# Patient Record
Sex: Male | Born: 1990 | Race: White | Hispanic: No | Marital: Married | Smoking: Never smoker
Health system: Southern US, Community
[De-identification: ages and names within clinical notes are randomized; demographics above are authoritative.]

---

## 2018-07-15 ENCOUNTER — Encounter: Payer: Self-pay | Admitting: *Deleted

## 2018-07-20 ENCOUNTER — Encounter: Payer: Self-pay | Admitting: Family Medicine

## 2018-07-20 ENCOUNTER — Ambulatory Visit (INDEPENDENT_AMBULATORY_CARE_PROVIDER_SITE_OTHER): Payer: Managed Care, Other (non HMO) | Admitting: Family Medicine

## 2018-07-20 VITALS — BP 122/80 | HR 67 | Ht 75.0 in | Wt 230.2 lb

## 2018-07-20 DIAGNOSIS — Z0000 Encounter for general adult medical examination without abnormal findings: Secondary | ICD-10-CM

## 2018-07-20 LAB — URINALYSIS, ROUTINE W REFLEX MICROSCOPIC
Ketones, ur: NEGATIVE
Leukocytes, UA: NEGATIVE
Nitrite: NEGATIVE
Specific Gravity, Urine: >=1.03 — AB (ref 1.000–1.030)
Total Protein, Urine: 30 — AB
Urine Glucose: NEGATIVE
Urobilinogen, UA: 0.2 (ref 0.0–1.0)
pH: 5.5 (ref 5.0–8.0)

## 2018-07-20 LAB — COMPREHENSIVE METABOLIC PANEL WITH GFR
ALT: 15 U/L (ref 0–53)
AST: 20 U/L (ref 0–37)
Albumin: 4.5 g/dL (ref 3.5–5.2)
Alkaline Phosphatase: 73 U/L (ref 39–117)
BUN: 11 mg/dL (ref 6–23)
CO2: 29 meq/L (ref 19–32)
Calcium: 9 mg/dL (ref 8.4–10.5)
Chloride: 104 meq/L (ref 96–112)
Creatinine, Ser: 1.06 mg/dL (ref 0.40–1.50)
GFR: 87.94 mL/min
Glucose, Bld: 93 mg/dL (ref 70–99)
Potassium: 4.3 meq/L (ref 3.5–5.1)
Sodium: 140 meq/L (ref 135–145)
Total Bilirubin: 1.3 mg/dL — ABNORMAL HIGH (ref 0.2–1.2)
Total Protein: 7.4 g/dL (ref 6.0–8.3)

## 2018-07-20 LAB — LIPID PANEL
CHOL/HDL RATIO: 5
Cholesterol: 132 mg/dL (ref 0–200)
HDL: 28.5 mg/dL — AB (ref >39.00)
LDL Cholesterol: 73 mg/dL (ref 0–99)
NONHDL: 103.37
Triglycerides: 152 mg/dL — ABNORMAL HIGH (ref 0.0–149.0)
VLDL: 30.4 mg/dL (ref 0.0–40.0)

## 2018-07-20 LAB — CBC
HCT: 48.2 % (ref 39.0–52.0)
Hemoglobin: 16.1 g/dL (ref 13.0–17.0)
MCHC: 33.4 g/dL (ref 30.0–36.0)
MCV: 84.3 fl (ref 78.0–100.0)
Platelets: 199 10*3/uL (ref 150.0–400.0)
RBC: 5.72 Mil/uL (ref 4.22–5.81)
RDW: 13.2 % (ref 11.5–15.5)
WBC: 6.9 10*3/uL (ref 4.0–10.5)

## 2018-07-20 NOTE — Progress Notes (Signed)
Subjective:  Patient ID: Jaivon Vanbeek, male    DOB: Jan 06, 1991  Age: 27 y.o. MRN: [id number]  CC: Establish Care   HPI Jyron Turman presents for a physical exam.  He is healthy as far as he knows and has no medical issues.  He has been in this country a year and a half now.  He works at a [employer].  He lives with his wife and 19-month-old son.  He is from [place] area.  His dad is 53 and has no health issues.  His mom is 50 and is also in good health.  1 of his grandmothers lived until age 27.  Patient does not smoke drink or use illicit drugs.  He exercises on a regular basis.  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  History Gregorio has no past medical history on file.   He has no past surgical history on file.   His family history is not on file.He reports that he has never smoked. He has never used smokeless tobacco. His alcohol and drug histories are not on file.  No outpatient medications prior to visit.   No facility-administered medications prior to visit.     ROS Review of Systems  Constitutional: Negative for chills, fever and unexpected weight change.  HENT: Negative.   Eyes: Negative.   Respiratory: Negative.   Cardiovascular: Negative.   Gastrointestinal: Negative.   Endocrine: Negative.   Genitourinary: Negative.   Musculoskeletal: Negative.   Allergic/Immunologic: Negative for immunocompromised state.  Neurological: Negative.   Hematological: Negative.   Psychiatric/Behavioral: Negative.     Objective:  BP 122/80   Pulse 67   Ht 6\' 3"  (1.905 m)   Wt 230 lb 4 oz (104.4 kg)   SpO2 97%   BMI 28.78 kg/m   Physical Exam  Constitutional: He is oriented to person, place, and time. He appears well-developed and well-nourished. No distress.  HENT:  Head: Normocephalic and atraumatic.  Right Ear: External ear normal.  Left Ear:  External ear normal.  Mouth/Throat: Oropharynx is clear and moist. No oropharyngeal exudate.  Eyes: Pupils are equal, round, and reactive to light. Conjunctivae and EOM are normal. Right eye exhibits no discharge. Left eye exhibits no discharge.  Neck: Normal range of motion. Neck supple. No JVD present. No tracheal deviation present. No thyromegaly present.  Cardiovascular: Normal rate, regular rhythm and normal heart sounds.  Pulmonary/Chest: Effort normal and breath sounds normal.  Abdominal: Soft. Bowel sounds are normal. He exhibits no distension. There is no guarding. Hernia confirmed negative in the right inguinal area and confirmed negative in the left inguinal area.  Genitourinary: Testes normal and penis normal. Right testis shows no mass and no tenderness. Left testis shows no swelling and no tenderness. Left testis is descended. Circumcised. No hypospadias or penile tenderness. No discharge found.  Musculoskeletal: He exhibits no edema, tenderness or deformity.  Lymphadenopathy:    He has no cervical adenopathy. No inguinal adenopathy noted on the right or left side.  Neurological: He is alert and oriented to person, place, and time. He displays normal reflexes.  Skin: Skin is warm and dry. He is not diaphoretic.  Psychiatric: He has a normal mood and affect.      Assessment & Plan:   Albaro was seen today for establish care.  Diagnoses and all orders for this visit:  Health care maintenance -  CBC -     Comprehensive metabolic panel -     Lipid panel -     Urinalysis, Routine w reflex microscopic   Raheel Lynam does not currently have medications on file.  No orders of the defined types were placed in this encounter.  Patient was encouraged to maintain his healthy lifestyle.  Anticipatory guidance was given for health maintenance and prevention of disease.  He will follow-up in a year or as needed.  Follow-up: Return if symptoms worsen or fail to  improve.  Mliss SaxWilliam Alfred Nikai Quest, MD

## 2018-07-20 NOTE — Patient Instructions (Signed)

## 2018-07-21 ENCOUNTER — Other Ambulatory Visit: Payer: Self-pay

## 2018-07-21 DIAGNOSIS — R8290 Unspecified abnormal findings in urine: Secondary | ICD-10-CM

## 2018-07-30 ENCOUNTER — Other Ambulatory Visit (INDEPENDENT_AMBULATORY_CARE_PROVIDER_SITE_OTHER): Payer: Managed Care, Other (non HMO)

## 2018-07-30 DIAGNOSIS — R8290 Unspecified abnormal findings in urine: Secondary | ICD-10-CM | POA: Diagnosis not present

## 2018-07-30 LAB — URINALYSIS, ROUTINE W REFLEX MICROSCOPIC
Bilirubin Urine: NEGATIVE
Hgb urine dipstick: NEGATIVE
KETONES UR: NEGATIVE
LEUKOCYTES UA: NEGATIVE
NITRITE: NEGATIVE
SPECIFIC GRAVITY, URINE: 1.025 (ref 1.000–1.030)
Total Protein, Urine: NEGATIVE
Urine Glucose: NEGATIVE
Urobilinogen, UA: 0.2 (ref 0.0–1.0)
WBC, UA: NONE SEEN — AB (ref 0–?)
pH: 6 (ref 5.0–8.0)

## 2018-07-30 LAB — POCT URINALYSIS DIPSTICK
BILIRUBIN UA: NEGATIVE
Blood, UA: NEGATIVE
Glucose, UA: NEGATIVE
Ketones, UA: NEGATIVE
Leukocytes, UA: NEGATIVE
Nitrite, UA: NEGATIVE
PROTEIN UA: NEGATIVE
Spec Grav, UA: >=1.03 — AB (ref 1.010–1.025)
Urobilinogen, UA: 0.2 E.U./dL
pH, UA: 6 (ref 5.0–8.0)

## 2018-07-30 NOTE — Progress Notes (Signed)
Also sent out for U/A with Micro/thx dmf

## 2019-07-25 ENCOUNTER — Telehealth: Payer: Self-pay

## 2019-07-25 NOTE — Telephone Encounter (Signed)
Questions for Screening COVID-19  Symptom onset: n/a  Travel or Contacts: none  During this illness, did/does the patient experience any of the following symptoms? Fever >100.4F []  Yes [x]  No []  Unknown Subjective fever (felt feverish) []  Yes [x]  No []  Unknown Chills []  Yes [x]  No []  Unknown Muscle aches (myalgia) []  Yes [x]  No []  Unknown Runny nose (rhinorrhea) []  Yes [x]  No []  Unknown Sore throat []  Yes [x]  No []  Unknown Cough (new onset or worsening of chronic cough) []  Yes [x]  No []  Unknown Shortness of breath (dyspnea) []  Yes [x]  No []  Unknown Nausea or vomiting []  Yes [x]  No []  Unknown Headache []  Yes [x]  No []  Unknown Abdominal pain  []  Yes [x]  No []  Unknown Diarrhea (?3 loose/looser than normal stools/24hr period) []  Yes [x]  No []  Unknown Other, specify:    

## 2019-07-26 ENCOUNTER — Encounter: Payer: Self-pay | Admitting: Family Medicine

## 2019-07-26 ENCOUNTER — Ambulatory Visit (INDEPENDENT_AMBULATORY_CARE_PROVIDER_SITE_OTHER): Payer: Managed Care, Other (non HMO) | Admitting: Family Medicine

## 2019-07-26 VITALS — BP 136/80 | HR 90 | Ht 75.0 in | Wt 232.4 lb

## 2019-07-26 DIAGNOSIS — Z0000 Encounter for general adult medical examination without abnormal findings: Secondary | ICD-10-CM

## 2019-07-26 LAB — LIPID PANEL
Cholesterol: 158 mg/dL (ref 0–200)
HDL: 34.6 mg/dL — ABNORMAL LOW (ref >39.00)
LDL Cholesterol: 100 mg/dL — ABNORMAL HIGH (ref 0–99)
NonHDL: 123.47
Total CHOL/HDL Ratio: 5
Triglycerides: 116 mg/dL (ref 0.0–149.0)
VLDL: 23.2 mg/dL (ref 0.0–40.0)

## 2019-07-26 LAB — CBC
HCT: 46.9 % (ref 39.0–52.0)
Hemoglobin: 15.7 g/dL (ref 13.0–17.0)
MCHC: 33.5 g/dL (ref 30.0–36.0)
MCV: 84 fl (ref 78.0–100.0)
Platelets: 222 10*3/uL (ref 150.0–400.0)
RBC: 5.58 Mil/uL (ref 4.22–5.81)
RDW: 13.7 % (ref 11.5–15.5)
WBC: 6.9 10*3/uL (ref 4.0–10.5)

## 2019-07-26 LAB — COMPREHENSIVE METABOLIC PANEL
ALT: 19 U/L (ref 0–53)
AST: 28 U/L (ref 0–37)
Albumin: 4.8 g/dL (ref 3.5–5.2)
Alkaline Phosphatase: 68 U/L (ref 39–117)
BUN: 14 mg/dL (ref 6–23)
CO2: 28 mEq/L (ref 19–32)
Calcium: 9.3 mg/dL (ref 8.4–10.5)
Chloride: 101 mEq/L (ref 96–112)
Creatinine, Ser: 1 mg/dL (ref 0.40–1.50)
GFR: 87.87 mL/min (ref >60.00)
Glucose, Bld: 91 mg/dL (ref 70–99)
Potassium: 3.9 mEq/L (ref 3.5–5.1)
Sodium: 136 mEq/L (ref 135–145)
Total Bilirubin: 1.2 mg/dL (ref 0.2–1.2)
Total Protein: 7.7 g/dL (ref 6.0–8.3)

## 2019-07-26 LAB — URINALYSIS, ROUTINE W REFLEX MICROSCOPIC
Bilirubin Urine: NEGATIVE
Hgb urine dipstick: NEGATIVE
Ketones, ur: NEGATIVE
Leukocytes,Ua: NEGATIVE
Nitrite: NEGATIVE
Specific Gravity, Urine: 1.02 (ref 1.000–1.030)
Total Protein, Urine: NEGATIVE
Urine Glucose: NEGATIVE
Urobilinogen, UA: 0.2 (ref 0.0–1.0)
pH: 7 (ref 5.0–8.0)

## 2019-07-26 NOTE — Patient Instructions (Signed)
Preventive Care 19-30 Years Old, Male Preventive care refers to lifestyle choices and visits with your health care provider that can promote health and wellness. This includes:  A yearly physical exam. This is also called an annual well check.  Regular dental and eye exams.  Immunizations.  Screening for certain conditions.  Healthy lifestyle choices, such as eating a healthy diet, getting regular exercise, not using drugs or products that contain nicotine and tobacco, and limiting alcohol use. What can I expect for my preventive care visit? Physical exam Your health care provider will check:  Height and weight. These may be used to calculate body mass index (BMI), which is a measurement that tells if you are at a healthy weight.  Heart rate and blood pressure.  Your skin for abnormal spots. Counseling Your health care provider may ask you questions about:  Alcohol, tobacco, and drug use.  Emotional well-being.  Home and relationship well-being.  Sexual activity.  Eating habits.  Work and work Statistician. What immunizations do I need?  Influenza (flu) vaccine  This is recommended every year. Tetanus, diphtheria, and pertussis (Tdap) vaccine  You may need a Td booster every 10 years. Varicella (chickenpox) vaccine  You may need this vaccine if you have not already been vaccinated. Human papillomavirus (HPV) vaccine  If recommended by your health care provider, you may need three doses over 6 months. Measles, mumps, and rubella (MMR) vaccine  You may need at least one dose of MMR. You may also need a second dose. Meningococcal conjugate (MenACWY) vaccine  One dose is recommended if you are 45-76 years old and a Market researcher living in a residence hall, or if you have one of several medical conditions. You may also need additional booster doses. Pneumococcal conjugate (PCV13) vaccine  You may need this if you have certain conditions and were not  previously vaccinated. Pneumococcal polysaccharide (PPSV23) vaccine  You may need one or two doses if you smoke cigarettes or if you have certain conditions. Hepatitis A vaccine  You may need this if you have certain conditions or if you travel or work in places where you may be exposed to hepatitis A. Hepatitis B vaccine  You may need this if you have certain conditions or if you travel or work in places where you may be exposed to hepatitis B. Haemophilus influenzae type b (Hib) vaccine  You may need this if you have certain risk factors. You may receive vaccines as individual doses or as more than one vaccine together in one shot (combination vaccines). Talk with your health care provider about the risks and benefits of combination vaccines. What tests do I need? Blood tests  Lipid and cholesterol levels. These may be checked every 5 years starting at age 17.  Hepatitis C test.  Hepatitis B test. Screening   Diabetes screening. This is done by checking your blood sugar (glucose) after you have not eaten for a while (fasting).  Sexually transmitted disease (STD) testing. Talk with your health care provider about your test results, treatment options, and if necessary, the need for more tests. Follow these instructions at home: Eating and drinking   Eat a diet that includes fresh fruits and vegetables, whole grains, lean protein, and low-fat dairy products.  Take vitamin and mineral supplements as recommended by your health care provider.  Do not drink alcohol if your health care provider tells you not to drink.  If you drink alcohol: ? Limit how much you have to 0-2  drinks a day. ? Be aware of how much alcohol is in your drink. In the [place] one drink equals one 12 oz bottle of beer (355 mL), one 5 oz glass of wine (148 mL), or one 1 oz glass of hard liquor (44 mL). Lifestyle  Take daily care of your teeth and gums.  Stay active. Exercise for at least 30 minutes on 5 or  more days each week.  Do not use any products that contain nicotine or tobacco, such as cigarettes, e-cigarettes, and chewing tobacco. If you need help quitting, ask your health care provider.  If you are sexually active, practice safe sex. Use a condom or other form of protection to prevent STIs (sexually transmitted infections). What's next?  Go to your health care provider once a year for a well check visit.  Ask your health care provider how often you should have your eyes and teeth checked.  Stay up to date on all vaccines. This information is not intended to replace advice given to you by your health care provider. Make sure you discuss any questions you have with your health care provider. Document Released: 02/10/2002 Document Revised: 12/09/2018 Document Reviewed: 12/09/2018 Elsevier Patient Education  2020 [place] 19-47 Years Old, Male Preventive care refers to lifestyle choices and visits with your health care provider that can promote health and wellness. This includes:  A yearly physical exam. This is also called an annual well check.  Regular dental and eye exams.  Immunizations.  Screening for certain conditions.  Healthy lifestyle choices, such as eating a healthy diet, getting regular exercise, not using drugs or products that contain nicotine and tobacco, and limiting alcohol use. What can I expect for my preventive care visit? Physical exam Your health care provider will check:  Height and weight. These may be used to calculate body mass index (BMI), which is a measurement that tells if you are at a healthy weight.  Heart rate and blood pressure.  Your skin for abnormal spots. Counseling Your health care provider may ask you questions about:  Alcohol, tobacco, and drug use.  Emotional well-being.  Home and relationship well-being.  Sexual activity.  Eating habits.  Work and work Statistician. What immunizations do I need?   Influenza (flu) vaccine  This is recommended every year. Tetanus, diphtheria, and pertussis (Tdap) vaccine  You may need a Td booster every 10 years. Varicella (chickenpox) vaccine  You may need this vaccine if you have not already been vaccinated. Human papillomavirus (HPV) vaccine  If recommended by your health care provider, you may need three doses over 6 months. Measles, mumps, and rubella (MMR) vaccine  You may need at least one dose of MMR. You may also need a second dose. Meningococcal conjugate (MenACWY) vaccine  One dose is recommended if you are 43-43 years old and a Market researcher living in a residence hall, or if you have one of several medical conditions. You may also need additional booster doses. Pneumococcal conjugate (PCV13) vaccine  You may need this if you have certain conditions and were not previously vaccinated. Pneumococcal polysaccharide (PPSV23) vaccine  You may need one or two doses if you smoke cigarettes or if you have certain conditions. Hepatitis A vaccine  You may need this if you have certain conditions or if you travel or work in places where you may be exposed to hepatitis A. Hepatitis B vaccine  You may need this if you have certain conditions or if you  travel or work in places where you may be exposed to hepatitis B. Haemophilus influenzae type b (Hib) vaccine  You may need this if you have certain risk factors. You may receive vaccines as individual doses or as more than one vaccine together in one shot (combination vaccines). Talk with your health care provider about the risks and benefits of combination vaccines. What tests do I need? Blood tests  Lipid and cholesterol levels. These may be checked every 5 years starting at age 49.  Hepatitis C test.  Hepatitis B test. Screening   Diabetes screening. This is done by checking your blood sugar (glucose) after you have not eaten for a while (fasting).  Sexually  transmitted disease (STD) testing. Talk with your health care provider about your test results, treatment options, and if necessary, the need for more tests. Follow these instructions at home: Eating and drinking   Eat a diet that includes fresh fruits and vegetables, whole grains, lean protein, and low-fat dairy products.  Take vitamin and mineral supplements as recommended by your health care provider.  Do not drink alcohol if your health care provider tells you not to drink.  If you drink alcohol: ? Limit how much you have to 0-2 drinks a day. ? Be aware of how much alcohol is in your drink. In the [place] one drink equals one 12 oz bottle of beer (355 mL), one 5 oz glass of wine (148 mL), or one 1 oz glass of hard liquor (44 mL). Lifestyle  Take daily care of your teeth and gums.  Stay active. Exercise for at least 30 minutes on 5 or more days each week.  Do not use any products that contain nicotine or tobacco, such as cigarettes, e-cigarettes, and chewing tobacco. If you need help quitting, ask your health care provider.  If you are sexually active, practice safe sex. Use a condom or other form of protection to prevent STIs (sexually transmitted infections). What's next?  Go to your health care provider once a year for a well check visit.  Ask your health care provider how often you should have your eyes and teeth checked.  Stay up to date on all vaccines. This information is not intended to replace advice given to you by your health care provider. Make sure you discuss any questions you have with your health care provider. Document Released: 02/10/2002 Document Revised: 12/09/2018 Document Reviewed: 12/09/2018 Elsevier Patient Education  2020 [employer].

## 2019-07-26 NOTE — Progress Notes (Signed)
Established Patient Office Visit  Subjective:  Patient ID: Paul Hausenoufik Market, male    DOB: 07-07-1991  Age: 28 y.o. MRN: [id number]  CC:  Chief Complaint  Patient presents with  . Annual Exam    HPI Paul Stein presents for his yearly physical.  Doing well.  Wife and almost 6352-month-old son are well.  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  Unable to travel home because of COVID.  Is looking into coaching volleyball.  Has seen the ophthalmologist due to issues with OS.  Unable to see the dentist this year.  History reviewed. No pertinent past medical history.  History reviewed. No pertinent surgical history.  History reviewed. No pertinent family history.  Social History   Socioeconomic History  . Marital status: Married    Spouse name: Not on file  . Number of children: Not on file  . Years of education: Not on file  . Highest education level: Not on file  Occupational History  . Not on file  Social Needs  . Financial resource strain: Not on file  . Food insecurity    Worry: Not on file    Inability: Not on file  . Transportation needs    Medical: Not on file    Non-medical: Not on file  Tobacco Use  . Smoking status: Never Smoker  . Smokeless tobacco: Never Used  Substance and Sexual Activity  . Alcohol use: Never    Frequency: Never  . Drug use: Never  . Sexual activity: Not on file  Lifestyle  . Physical activity    Days per week: Not on file    Minutes per session: Not on file  . Stress: Not on file  Relationships  . Social Musicianconnections    Talks on phone: Not on file    Gets together: Not on file    Attends religious service: Not on file    Active member of club or organization: Not on file    Attends meetings of clubs or organizations: Not on file    Relationship status: Not on file  . Intimate partner violence    Fear of current or ex partner: Not on file    Emotionally abused: Not on file    Physically abused: Not on file    Forced sexual  activity: Not on file  Other Topics Concern  . Not on file  Social History Narrative  . Not on file    No outpatient medications prior to visit.   No facility-administered medications prior to visit.     No Known Allergies  ROS Review of Systems  Constitutional: Negative.   HENT: Negative.   Eyes: Positive for visual disturbance. Negative for photophobia, pain, discharge, redness and itching.  Respiratory: Negative.   Cardiovascular: Negative.   Gastrointestinal: Negative.   Endocrine: Negative for polyphagia and polyuria.  Genitourinary: Negative.   Musculoskeletal: Negative.   Skin: Negative.   Allergic/Immunologic: Negative for immunocompromised state.  Neurological: Negative for light-headedness and headaches.  Hematological: Negative.   Psychiatric/Behavioral: Negative.       Objective:    Physical Exam  Constitutional: He is oriented to person, place, and time. He appears well-developed and well-nourished. No distress.  HENT:  Head: Normocephalic and atraumatic.  Right Ear: External ear normal.  Left Ear: External ear normal.  Mouth/Throat: Oropharynx is clear and moist. No oropharyngeal exudate.  Eyes: Pupils are equal, round, and reactive to light. Conjunctivae are normal. Right eye exhibits no discharge. Left eye exhibits no  discharge. No scleral icterus.  Neck: Neck supple. No JVD present. No tracheal deviation present. No thyromegaly present.  Cardiovascular: Normal rate, regular rhythm and normal heart sounds.  Pulmonary/Chest: Effort normal and breath sounds normal. No stridor.  Abdominal: Bowel sounds are normal. He exhibits no distension and no mass. There is no abdominal tenderness. There is no rebound and no guarding.  Musculoskeletal: Normal range of motion.        General: No tenderness or edema.  Lymphadenopathy:    He has no cervical adenopathy.  Neurological: He is alert and oriented to person, place, and time.  Skin: Skin is warm and dry. He  is not diaphoretic.  Psychiatric: He has a normal mood and affect. His behavior is normal.    BP 136/80   Pulse 90   Ht 6\' 3"  (1.905 m)   Wt 232 lb 6 oz (105.4 kg)   SpO2 96%   BMI 29.04 kg/m  Wt Readings from Last 3 Encounters:  07/26/19 232 lb 6 oz (105.4 kg)  07/20/18 230 lb 4 oz (104.4 kg)   BP Readings from Last 3 Encounters:  07/26/19 136/80  07/20/18 122/80   Guideline developer:  UpToDate (see UpToDate for funding source) Date Released: June 2014  Health Maintenance Due  Topic Date Due  . HIV Screening  10/17/2004  . TETANUS/TDAP  10/17/2008    There are no preventive care reminders to display for this patient.  No results found for: TSH Lab Results  Component Value Date   WBC 6.9 07/20/2018   HGB 16.1 07/20/2018   HCT 48.2 07/20/2018   MCV 84.3 07/20/2018   PLT 199.0 07/20/2018   Lab Results  Component Value Date   NA 140 07/20/2018   K 4.3 07/20/2018   CO2 29 07/20/2018   GLUCOSE 93 07/20/2018   BUN 11 07/20/2018   CREATININE 1.06 07/20/2018   BILITOT 1.3 (H) 07/20/2018   ALKPHOS 73 07/20/2018   AST 20 07/20/2018   ALT 15 07/20/2018   PROT 7.4 07/20/2018   ALBUMIN 4.5 07/20/2018   CALCIUM 9.0 07/20/2018   GFR 87.94 07/20/2018   Lab Results  Component Value Date   CHOL 132 07/20/2018   Lab Results  Component Value Date   HDL 28.50 (L) 07/20/2018   Lab Results  Component Value Date   LDLCALC 73 07/20/2018   Lab Results  Component Value Date   TRIG 152.0 (H) 07/20/2018   Lab Results  Component Value Date   CHOLHDL 5 07/20/2018   No results found for: HGBA1C    Assessment & Plan:   Problem List Items Addressed This Visit    None    Visit Diagnoses    Healthcare maintenance    -  Primary   Relevant Orders   CBC   Comprehensive metabolic panel   Lipid panel   Urinalysis, Routine w reflex microscopic      No orders of the defined types were placed in this encounter.   Follow-up: No follow-ups on file.   Patient  given information on health maintenance and disease prevention.  Encourage exercise and physical activity.  He will see the dentist when he is able.

## 2020-10-29 DIAGNOSIS — L858 Other specified epidermal thickening: Secondary | ICD-10-CM | POA: Diagnosis not present

## 2020-10-29 DIAGNOSIS — D485 Neoplasm of uncertain behavior of skin: Secondary | ICD-10-CM | POA: Diagnosis not present

## 2020-10-29 DIAGNOSIS — D225 Melanocytic nevi of trunk: Secondary | ICD-10-CM | POA: Diagnosis not present

## 2021-01-01 DIAGNOSIS — Z20828 Contact with and (suspected) exposure to other viral communicable diseases: Secondary | ICD-10-CM | POA: Diagnosis not present

## 2021-01-02 DIAGNOSIS — Z20822 Contact with and (suspected) exposure to covid-19: Secondary | ICD-10-CM | POA: Diagnosis not present

## 2021-01-02 DIAGNOSIS — U071 COVID-19: Secondary | ICD-10-CM | POA: Diagnosis not present

## 2021-07-22 DIAGNOSIS — Z20822 Contact with and (suspected) exposure to covid-19: Secondary | ICD-10-CM | POA: Diagnosis not present

## 2021-10-08 ENCOUNTER — Emergency Department (HOSPITAL_COMMUNITY)
Admission: EM | Admit: 2021-10-08 | Discharge: 2021-10-08 | Disposition: A | Discharge status: Home / Self Care | Payer: No Typology Code available for payment source | Attending: Emergency Medicine | Admitting: Emergency Medicine

## 2021-10-08 ENCOUNTER — Emergency Department (HOSPITAL_COMMUNITY): Payer: No Typology Code available for payment source

## 2021-10-08 ENCOUNTER — Other Ambulatory Visit: Payer: Self-pay

## 2021-10-08 DIAGNOSIS — W208XXA Other cause of strike by thrown, projected or falling object, initial encounter: Secondary | ICD-10-CM | POA: Diagnosis not present

## 2021-10-08 DIAGNOSIS — S62634A Displaced fracture of distal phalanx of right ring finger, initial encounter for closed fracture: Secondary | ICD-10-CM | POA: Insufficient documentation

## 2021-10-08 DIAGNOSIS — S60944A Unspecified superficial injury of right ring finger, initial encounter: Secondary | ICD-10-CM | POA: Diagnosis present

## 2021-10-08 DIAGNOSIS — Y9389 Activity, other specified: Secondary | ICD-10-CM | POA: Diagnosis not present

## 2021-10-08 DIAGNOSIS — Y990 Civilian activity done for income or pay: Secondary | ICD-10-CM | POA: Insufficient documentation

## 2021-10-08 DIAGNOSIS — Y9289 Other specified places as the place of occurrence of the external cause: Secondary | ICD-10-CM | POA: Diagnosis not present

## 2021-10-08 MED ORDER — IBUPROFEN 800 MG PO TABS
800.0000 mg | ORAL_TABLET | Freq: Once | ORAL | Status: AC
  Administered 2021-10-08: 800 mg via ORAL
  Filled 2021-10-08: qty 1

## 2021-10-08 NOTE — ED Provider Notes (Signed)
Emergency Medicine Provider Triage Evaluation Note  Paul Stein , a 30 y.o. male  was evaluated in triage.  Pt complains of right ring finger crush injury that occurred pta. Was at urgent care and sent here for further eval.  Review of Systems  Positive: Finger pain Negative: fever  Physical Exam  There were no vitals taken for this visit. Gen:   Awake, no distress   Resp:  Normal effort  MSK:   Moves extremities without difficulty   Medical Decision Making  Medically screening exam initiated at 12:39 PM.  Appropriate orders placed.  Hasani Diemer was informed that the remainder of the evaluation will be completed by another provider, this initial triage assessment does not replace that evaluation, and the importance of remaining in the ED until their evaluation is complete.     Karrie Meres, PA-C 10/08/21 1239    Terald Sleeper, MD 10/08/21 [phone]

## 2021-10-08 NOTE — ED Triage Notes (Signed)
Patient here to see hand surgeon after right ring finger crush injury today. Patient with laceration and fracture. Wrapped/ dressed prior to arrival

## 2021-10-08 NOTE — ED Notes (Signed)
Pt alert, NAD, calm, interactive, resps e/u, speaking in clear complete sentences. EDPA at BS.  

## 2021-10-08 NOTE — Discharge Instructions (Addendum)
I spoke with Earney Hamburg, a PA with hand surgery.  Their office would like to see you this week.  Call this office in the morning for their first appointment this week. Take over-the-counter ibuprofen and Tylenol to control your pain.  Do not remove the splint that has been applied until you see the hand office.  Read information about finger fractures attached your discharge papers.  It was a pleasure to meet you and I hope that you feel better.

## 2021-10-08 NOTE — ED Notes (Signed)
Pt seen by EDPA prior to RN assessment, see PA notes, orders received to medicate, splint and d/c. Pt splinted prior to assessment. Reports feels better. Pain improved. Denies numbness or tingling. Dressing CDI.

## 2021-10-08 NOTE — ED Provider Notes (Signed)
[facility] EMERGENCY DEPARTMENT Provider Note   CSN: [id number] Arrival date & time: 10/08/21  1214     History No chief complaint on file.   Paul Stein is a 30 y.o. male with no past medical history presenting today after a right fourth finger injury.  He works in [employer] and dropped a metal bar onto his right hand this morning.  He is not on blood thinners.  He had immediate sharp pain and bleeding.  He went to urgent care and urgent care sent him to the emergency department for further evaluation.  Denies any numbness or tingling and is still able to move his hand and all 5 digits.  He has not utilized any over-the-counter medications for pain.  Unknown last tetanus.   No past medical history on file.  Patient Active Problem List   Diagnosis Date Noted   Health care maintenance 07/20/2018    No past surgical history on file.     No family history on file.  Social History   Tobacco Use   Smoking status: Never   Smokeless tobacco: Never  Substance Use Topics   Alcohol use: Never   Drug use: Never    Home Medications Prior to Admission medications   Not on File    Allergies    Patient has no known allergies.  Review of Systems   Review of Systems  Musculoskeletal:  Positive for arthralgias.  Skin:  Positive for wound.  Neurological:  Negative for dizziness, syncope, numbness and headaches.  All other systems reviewed and are negative.  Physical Exam Updated Vital Signs BP (!) 119/93 (BP Location: Right Arm)   Pulse 93   Temp 99.1 F (37.3 C) (Oral)   Resp 16   SpO2 99%   Physical Exam Vitals and nursing note reviewed.  Constitutional:      Appearance: Normal appearance.  HENT:     Head: Normocephalic and atraumatic.  Eyes:     General: No scleral icterus.    Conjunctiva/sclera: Conjunctivae normal.  Pulmonary:     Effort: Pulmonary effort is normal. No respiratory distress.  Musculoskeletal:        General:  Deformity and signs of injury present. Normal range of motion.     Comments: Photo of patient's injury is in the chart.  He has a 2 cm laceration to the dorsal side of his fourth digit.  No exposed bone.  Large amounts of bruising on palmar surface.  Sensation fully intact and patient able to flex and extend his DIP with pain.  Skin:    General: Skin is warm and dry.     Findings: No rash.  Neurological:     Mental Status: He is alert.  Psychiatric:        Mood and Affect: Mood normal.        Behavior: Behavior normal.    ED Results / Procedures / Treatments   Labs (all labs ordered are listed, but only abnormal results are displayed) Labs Reviewed - No data to display  EKG None  Radiology DG Finger Ring Right  Result Date: 10/08/2021 CLINICAL DATA:  Pain right ring finger crush injury. EXAM: RIGHT RING FINGER 2+V COMPARISON:  None. FINDINGS: There is an acute fracture involving the base of the fourth distal phalanx. There is mild volar angulation and mild posterior displacement of the distal fracture fragment. Overlying soft tissue gas and irregularity compatible with laceration. IMPRESSION: Acute fracture involves the base of the fourth distal phalanx. Mild  volar angulation and posterior displacement of the distal fracture fragment. Electronically Signed   By: Signa Kell M.D.   On: 10/08/2021 13:32    Procedures Procedures   Medications Ordered in ED Medications  ibuprofen (ADVIL) tablet 800 mg (has no administration in time range)    ED Course  I have reviewed the triage vital signs and the nursing notes.  Pertinent labs & imaging results that were available during my care of the patient were reviewed by me and considered in my medical decision making (see chart for details).    MDM Rules/Calculators/A&P Patient was evaluated by me in the hallway.  He had an obvious deformity and laceration.  His bleeding was controlled and there was no exposed vasculature, nerves or  bone.  Patient was able to flex and extend all joints of his right fourth digit.  X-ray was obtained which revealed a fracture of the fourth distal phalanx.  This fracture was noted to be angulated and displaced.  I spoke with Hand PA Earney Hamburg about this patient.  He recommended that we clean the patient's wound, dress it with Xeroform, buddy tape the fourth digit to the middle finger and apply splint.  Patient may then follow-up with Dr. Debby Bud office this week.  I discussed this with the patient and he is agreeable to this plan.  He may utilize ibuprofen or Tylenol for pain control and call Dr. Debby Bud office tomorrow morning.  Tetanus updated.  Final Clinical Impression(s) / ED Diagnoses Final diagnoses:  Closed displaced fracture of distal phalanx of right ring finger, initial encounter    Rx / DC Orders Results and diagnoses were explained to the patient. Return precautions discussed in full. Patient had no additional questions and expressed complete understanding.     Saddie Benders, PA-C 10/08/21 1620    Pricilla Loveless, MD 10/11/21 [phone]

## 2021-10-24 ENCOUNTER — Encounter: Payer: Self-pay | Admitting: Family Medicine

## 2021-12-02 DIAGNOSIS — J101 Influenza due to other identified influenza virus with other respiratory manifestations: Secondary | ICD-10-CM | POA: Diagnosis not present

## 2021-12-12 DIAGNOSIS — J209 Acute bronchitis, unspecified: Secondary | ICD-10-CM | POA: Diagnosis not present

## 2022-05-13 ENCOUNTER — Ambulatory Visit: Payer: Self-pay | Admitting: Family Medicine

## 2022-05-19 ENCOUNTER — Encounter: Payer: Self-pay | Admitting: Family Medicine

## 2022-05-19 ENCOUNTER — Ambulatory Visit (INDEPENDENT_AMBULATORY_CARE_PROVIDER_SITE_OTHER): Payer: BC Managed Care – PPO | Admitting: Family Medicine

## 2022-05-19 VITALS — BP 122/74 | HR 82 | Temp 97.8°F | Ht 74.0 in | Wt 242.4 lb

## 2022-05-19 DIAGNOSIS — Z0000 Encounter for general adult medical examination without abnormal findings: Secondary | ICD-10-CM

## 2022-05-19 NOTE — Progress Notes (Signed)
Established Patient Office Visit  Subjective   Patient ID: Paul Stein, male    DOB: 1991/04/08  Age: 31 y.o. MRN: [id number]  Chief Complaint  Patient presents with   Annual Exam    CPE, no concerns. Patient not fasting.     HPI health check and physical exam.  Doing well.  Continues to work hard with 2 jobs at times.  Has not had a lot of time to exercise.  He does walk when he can and plays with his son.  Continues to be interested in playing volleyball with friends.  Married living with his now 40-year-old son.  Family is back on [place].  They are planning a visit perhaps next year.  He has regular dental care.  Does not smoke or drink alcohol.    Review of Systems  Constitutional: Negative.   HENT: Negative.    Eyes:  Negative for blurred vision, discharge and redness.  Respiratory: Negative.    Cardiovascular: Negative.   Gastrointestinal:  Negative for abdominal pain.  Genitourinary: Negative.   Musculoskeletal: Negative.  Negative for myalgias.  Skin:  Negative for rash.  Neurological:  Negative for tingling, loss of consciousness and weakness.  Endo/Heme/Allergies:  Negative for polydipsia.     Objective:     BP 122/74 (BP Location: Left Arm, Patient Position: Sitting, Cuff Size: Large)   Pulse 82   Temp 97.8 F (36.6 C) (Temporal)   Ht 6\' 2"  (1.88 m)   Wt 242 lb 6.4 oz (110 kg)   SpO2 96%   BMI 31.12 kg/m  Wt Readings from Last 3 Encounters:  05/19/22 242 lb 6.4 oz (110 kg)  07/26/19 232 lb 6 oz (105.4 kg)  07/20/18 230 lb 4 oz (104.4 kg)      Physical Exam Constitutional:      General: He is not in acute distress.    Appearance: Normal appearance. He is not ill-appearing, toxic-appearing or diaphoretic.  HENT:     Head: Normocephalic and atraumatic.     Right Ear: External ear normal.     Left Ear: External ear normal.     Mouth/Throat:     Mouth: Mucous membranes are moist.     Pharynx: Oropharynx is clear. No oropharyngeal exudate or  posterior oropharyngeal erythema.  Eyes:     General: No scleral icterus.       Right eye: No discharge.        Left eye: No discharge.     Extraocular Movements: Extraocular movements intact.     Conjunctiva/sclera: Conjunctivae normal.     Pupils: Pupils are equal, round, and reactive to light.  Cardiovascular:     Rate and Rhythm: Normal rate and regular rhythm.  Pulmonary:     Effort: Pulmonary effort is normal. No respiratory distress.     Breath sounds: Normal breath sounds.  Abdominal:     General: Bowel sounds are normal.     Tenderness: There is no abdominal tenderness. There is no guarding.  Musculoskeletal:     Cervical back: No rigidity or tenderness.  Skin:    General: Skin is warm and dry.  Neurological:     Mental Status: He is alert and oriented to person, place, and time.  Psychiatric:        Mood and Affect: Mood normal.        Behavior: Behavior normal.     No results found for any visits on 05/19/22.    The ASCVD Risk score (Arnett DK, et  al., 2019) failed to calculate for the following reasons:   The 2019 ASCVD risk score is only valid for ages 81 to 35    Assessment & Plan:   Problem List Items Addressed This Visit       Other   Health care maintenance - Primary   Relevant Orders   CBC   Comprehensive metabolic panel   Lipid panel   Urinalysis, Routine w reflex microscopic    Return in about 1 year (around 05/20/2023), or return fasting for blood work..  Encourage exercise even if it is just walking.  Will return fasting for above ordered blood work.  Information given on health maintenance and disease prevention.  Mliss Sax, MD

## 2022-05-21 ENCOUNTER — Other Ambulatory Visit (INDEPENDENT_AMBULATORY_CARE_PROVIDER_SITE_OTHER): Payer: BC Managed Care – PPO

## 2022-05-21 DIAGNOSIS — Z0000 Encounter for general adult medical examination without abnormal findings: Secondary | ICD-10-CM

## 2022-05-21 LAB — COMPREHENSIVE METABOLIC PANEL
ALT: 20 U/L (ref 0–53)
AST: 25 U/L (ref 0–37)
Albumin: 4.6 g/dL (ref 3.5–5.2)
Alkaline Phosphatase: 70 U/L (ref 39–117)
BUN: 15 mg/dL (ref 6–23)
CO2: 27 mEq/L (ref 19–32)
Calcium: 8.8 mg/dL (ref 8.4–10.5)
Chloride: 103 mEq/L (ref 96–112)
Creatinine, Ser: 0.98 mg/dL (ref 0.40–1.50)
GFR: 101.97 mL/min (ref >60.00)
Glucose, Bld: 86 mg/dL (ref 70–99)
Potassium: 3.9 mEq/L (ref 3.5–5.1)
Sodium: 139 mEq/L (ref 135–145)
Total Bilirubin: 1.3 mg/dL — ABNORMAL HIGH (ref 0.2–1.2)
Total Protein: 7.5 g/dL (ref 6.0–8.3)

## 2022-05-21 LAB — CBC
HCT: 45.4 % (ref 39.0–52.0)
Hemoglobin: 15.6 g/dL (ref 13.0–17.0)
MCHC: 34.3 g/dL (ref 30.0–36.0)
MCV: 82.6 fl (ref 78.0–100.0)
Platelets: 197 10*3/uL (ref 150.0–400.0)
RBC: 5.5 Mil/uL (ref 4.22–5.81)
RDW: 13.5 % (ref 11.5–15.5)
WBC: 6.7 10*3/uL (ref 4.0–10.5)

## 2022-05-21 LAB — URINALYSIS, ROUTINE W REFLEX MICROSCOPIC
Bilirubin Urine: NEGATIVE
Ketones, ur: NEGATIVE
Leukocytes,Ua: NEGATIVE
Nitrite: NEGATIVE
RBC / HPF: NONE SEEN (ref 0–?)
Specific Gravity, Urine: >=1.03 — AB (ref 1.000–1.030)
Total Protein, Urine: NEGATIVE
Urine Glucose: NEGATIVE
Urobilinogen, UA: 0.2 (ref 0.0–1.0)
pH: 6 (ref 5.0–8.0)

## 2022-05-21 LAB — LIPID PANEL
Cholesterol: 169 mg/dL (ref 0–200)
HDL: 31.8 mg/dL — ABNORMAL LOW (ref >39.00)
NonHDL: 137.08
Total CHOL/HDL Ratio: 5
Triglycerides: 275 mg/dL — ABNORMAL HIGH (ref 0.0–149.0)
VLDL: 55 mg/dL — ABNORMAL HIGH (ref 0.0–40.0)

## 2022-05-21 LAB — LDL CHOLESTEROL, DIRECT: Direct LDL: 92 mg/dL

## 2022-05-23 ENCOUNTER — Encounter: Payer: Self-pay | Admitting: Family Medicine

## 2022-06-19 DIAGNOSIS — J039 Acute tonsillitis, unspecified: Secondary | ICD-10-CM | POA: Diagnosis not present

## 2022-06-24 DIAGNOSIS — H109 Unspecified conjunctivitis: Secondary | ICD-10-CM | POA: Diagnosis not present

## 2022-07-17 DIAGNOSIS — J309 Allergic rhinitis, unspecified: Secondary | ICD-10-CM | POA: Diagnosis not present

## 2022-07-17 DIAGNOSIS — H109 Unspecified conjunctivitis: Secondary | ICD-10-CM | POA: Diagnosis not present

## 2022-07-22 ENCOUNTER — Ambulatory Visit: Payer: BC Managed Care – PPO | Admitting: Family Medicine

## 2022-07-22 ENCOUNTER — Encounter: Payer: Self-pay | Admitting: Family Medicine

## 2022-07-22 VITALS — BP 116/78 | HR 74 | Temp 97.8°F | Wt 245.0 lb

## 2022-07-22 DIAGNOSIS — K219 Gastro-esophageal reflux disease without esophagitis: Secondary | ICD-10-CM | POA: Diagnosis not present

## 2022-07-22 DIAGNOSIS — R0989 Other specified symptoms and signs involving the circulatory and respiratory systems: Secondary | ICD-10-CM

## 2022-07-22 DIAGNOSIS — R1310 Dysphagia, unspecified: Secondary | ICD-10-CM | POA: Diagnosis not present

## 2022-07-22 DIAGNOSIS — R09A2 Foreign body sensation, throat: Secondary | ICD-10-CM

## 2022-07-22 MED ORDER — FAMOTIDINE 10 MG PO TABS: 10.0000 mg | ORAL_TABLET | Freq: Two times a day (BID) | ORAL | 0 refills | Status: DC

## 2022-07-22 NOTE — Patient Instructions (Addendum)
Reduce caffeine, eat meals no later than 2 hours before bed, consider weight loss through healthy diet and exercise, and can put bet on incline   Try famotidine

## 2022-07-22 NOTE — Progress Notes (Signed)
Assessment/Plan:   Problem List Items Addressed This Visit       Digestive   Gastroesophageal reflux disease - Primary    I believe patient's symptoms of GERD are associated with his denies dysphagia/globus Did discuss potential risk of throat/esophageal cancer, and importance and need follow-up Recommend lifestyle modifications as provided in patient handout Trial of famotidine Follow-up in 4 weeks if no improvement, can trial PPI and/or consider referral to GI if no improvement      Relevant Medications   famotidine (PEPCID) 10 MG tablet     Other   Odynophagia   Other Visit Diagnoses     Globus pharyngeus              Subjective:  HPI:  Paul Stein is a 31 y.o. male who has Health care maintenance; Gastroesophageal reflux disease; and Odynophagia on their problem list..   He  has no past medical history on file.Marland Kitchen   He presents with chief complaint of Throat tightness (When patient eats or drinks something cold his throat feels tight x 2 weeks. Like something is stuck in his throat. ) .  Sore Throat: Patient complains of sore throat. Associated symptoms include sore throat.Onset of symptoms was 3 week ago, unchanged since that time. He is drinking plenty of fluids. He does not have had recent close exposure to someone with proven streptococcal pharyngitis.  GERD: Paitent complains of heartburn. This has been associated with difficulty swallowing and dysphagia.  He denies belching. Symptoms have been present for 3 weeks. He has had dysphagia for both solids and liquids.  He has not lost weight. He denies melena, hematochezia, hematemesis, and coffee ground emesis. Medical therapy in the past has included antacids.  Symptoms worse with greasy and fatty foods.  Patient also drinks approximately 3 cups of coffee a day. He does not drink any alcohol.  Denies cough, chest pain, shortness of breath, fevers, rhinitis, dysphagia,   No past surgical history on  file.  Outpatient Medications Prior to Visit  Medication Sig Dispense Refill   cetirizine (ZYRTEC) 10 MG tablet Take 10 mg by mouth daily.     No facility-administered medications prior to visit.    No family history on file.  Social History   Socioeconomic History   Marital status: Married    Spouse name: Not on file   Number of children: Not on file   Years of education: Not on file   Highest education level: Not on file  Occupational History   Not on file  Tobacco Use   Smoking status: Never   Smokeless tobacco: Never  Vaping Use   Vaping Use: Never used  Substance and Sexual Activity   Alcohol use: Never   Drug use: Never   Sexual activity: Yes  Other Topics Concern   Not on file  Social History Narrative   Not on file   Social Determinants of Health   Financial Resource Strain: Not on file  Food Insecurity: Not on file  Transportation Needs: Not on file  Physical Activity: Not on file  Stress: Not on file  Social Connections: Not on file  Intimate Partner Violence: Not on file  Objective:  Physical Exam: BP 116/78 (BP Location: Right Arm, Patient Position: Sitting, Cuff Size: Large)   Pulse 74   Temp 97.8 F (36.6 C) (Temporal)   Wt 245 lb (111.1 kg)   SpO2 98%   BMI 31.46 kg/m    General: No acute distress. Awake and conversant.  Eyes: Normal conjunctiva, anicteric. Round symmetric pupils.  ENT: Hearing grossly intact. No nasal discharge. ,  MMM, no oropharyngeal lesions, normal pharynx, Neck: Neck is supple. No masses or thyromegaly.  No lymphadenopathy, no abnormalities when palpating swallow Respiratory: Respirations are non-labored. No auditory wheezing.  Skin: Warm. No rashes or ulcers.  Psych: Alert and oriented. Cooperative, Appropriate mood and affect, Normal judgment.  CV: No cyanosis or JVD MSK: Normal ambulation. No clubbing  Neuro:  Sensation and CN II-XII grossly normal.        Garner Nash, MD, MS

## 2022-07-22 NOTE — Assessment & Plan Note (Signed)
I believe patient's symptoms of GERD are associated with his denies dysphagia/globus Did discuss potential risk of throat/esophageal cancer, and importance and need follow-up Recommend lifestyle modifications as provided in patient handout Trial of famotidine Follow-up in 4 weeks if no improvement, can trial PPI and/or consider referral to GI if no improvement

## 2022-08-11 ENCOUNTER — Ambulatory Visit: Payer: BC Managed Care – PPO | Admitting: Family Medicine

## 2022-08-11 ENCOUNTER — Encounter: Payer: Self-pay | Admitting: Family Medicine

## 2022-08-11 VITALS — BP 122/82 | HR 78 | Temp 97.2°F | Ht 74.0 in | Wt 242.8 lb

## 2022-08-11 DIAGNOSIS — S29011A Strain of muscle and tendon of front wall of thorax, initial encounter: Secondary | ICD-10-CM

## 2022-08-11 NOTE — Progress Notes (Signed)
Established Patient Office Visit  Subjective   Patient ID: Paul Stein, male    DOB: [birth year]  Age: 31 y.o. MRN: [id number]  Chief Complaint  Patient presents with   Chest Pain    Chest pains started yesterday.     Chest Pain  Pertinent negatives include no abdominal pain, diaphoresis, nausea, palpitations, shortness of breath, vomiting or weakness.   developed right anterior upper chest pain after working out in the gym 3 days ago.  He was doing bench presses and flies.  He is left-hand dominant.  There is swelling and pain in the affected area.    Review of Systems  Constitutional: Negative.  Negative for diaphoresis.  HENT: Negative.    Eyes:  Negative for blurred vision, discharge and redness.  Respiratory: Negative.  Negative for shortness of breath.   Cardiovascular:  Positive for chest pain. Negative for palpitations.  Gastrointestinal:  Negative for abdominal pain, nausea and vomiting.  Genitourinary: Negative.   Musculoskeletal:  Positive for myalgias.  Skin:  Negative for rash.  Neurological:  Negative for tingling, loss of consciousness and weakness.  Endo/Heme/Allergies:  Negative for polydipsia.      Objective:     BP 122/82 (BP Location: Right Arm, Patient Position: Sitting, Cuff Size: Large)   Pulse 78   Temp (!) 97.2 F (36.2 C) (Temporal)   Ht 6\' 2"  (1.88 m)   Wt 242 lb 12.8 oz (110.1 kg)   SpO2 97%   BMI 31.17 kg/m    Physical Exam Constitutional:      General: He is not in acute distress.    Appearance: Normal appearance. He is not ill-appearing, toxic-appearing or diaphoretic.  HENT:     Head: Normocephalic and atraumatic.     Right Ear: External ear normal.     Left Ear: External ear normal.     Mouth/Throat:     Mouth: Mucous membranes are moist.     Pharynx: Oropharynx is clear. No oropharyngeal exudate or posterior oropharyngeal erythema.  Eyes:     General: No scleral icterus.       Right eye: No discharge.        Left eye:  No discharge.     Extraocular Movements: Extraocular movements intact.     Conjunctiva/sclera: Conjunctivae normal.     Pupils: Pupils are equal, round, and reactive to light.  Cardiovascular:     Rate and Rhythm: Normal rate and regular rhythm.  Pulmonary:     Effort: Pulmonary effort is normal. No respiratory distress.     Breath sounds: Normal breath sounds.  Chest:    Abdominal:     General: Bowel sounds are normal.     Palpations: Abdomen is soft.     Tenderness: There is no abdominal tenderness. There is no guarding.  Musculoskeletal:     Cervical back: No rigidity or tenderness.  Skin:    General: Skin is warm and dry.  Neurological:     Mental Status: He is alert and oriented to person, place, and time.  Psychiatric:        Mood and Affect: Mood normal.        Behavior: Behavior normal.      No results found for any visits on 08/11/22.    The ASCVD Risk score (Arnett DK, et al., 2019) failed to calculate for the following reasons:   The 2019 ASCVD risk score is only valid for ages 27 to 18    Assessment & Plan:   Problem  List Items Addressed This Visit       Musculoskeletal and Integument   Pectoralis muscle strain - Primary    Return in about 4 weeks (around 09/08/2022), or if symptoms worsen or fail to improve.  Information was given about pectoralis strain.  Hold weight lifting for a week.  May return with much lighter weights and higher reps.  Let me know if not improving in 2 to 4 weeks.  May use ibuprofen 3 times a day as needed.  Mliss Sax, MD

## 2022-08-19 ENCOUNTER — Ambulatory Visit: Payer: BC Managed Care – PPO | Admitting: Family Medicine

## 2022-09-08 ENCOUNTER — Ambulatory Visit: Payer: BC Managed Care – PPO | Admitting: Family Medicine

## 2022-09-08 ENCOUNTER — Telehealth: Payer: Self-pay | Admitting: Family Medicine

## 2022-09-08 NOTE — Telephone Encounter (Signed)
Pt was a no show for an OV with Dr. Doreene Burke on 09/08/22, I sent a letter.

## 2022-09-10 NOTE — Telephone Encounter (Signed)
3rd no show, fee generated, pt is scheduled for cpe 04/2023, letter has been mailed requesting pt to call and rescheduled this OV for follow up.  10/24/21 no show 05/13/22 no show 09/08/22 no show  Please advise if you want me to send final warning letter or dismissal letter.

## 2022-09-11 ENCOUNTER — Encounter: Payer: Self-pay | Admitting: Family Medicine

## 2022-09-11 NOTE — Telephone Encounter (Signed)
Sent final warning letter via mail and MyChart

## 2023-03-01 ENCOUNTER — Emergency Department (HOSPITAL_COMMUNITY)
Admission: EM | Admit: 2023-03-01 | Discharge: 2023-03-01 | Disposition: A | Discharge status: Home / Self Care | Payer: BC Managed Care – PPO | Attending: Emergency Medicine | Admitting: Emergency Medicine

## 2023-03-01 ENCOUNTER — Encounter (HOSPITAL_COMMUNITY): Payer: Self-pay

## 2023-03-01 ENCOUNTER — Other Ambulatory Visit: Payer: Self-pay

## 2023-03-01 ENCOUNTER — Emergency Department (HOSPITAL_COMMUNITY): Payer: BC Managed Care – PPO

## 2023-03-01 DIAGNOSIS — R110 Nausea: Secondary | ICD-10-CM | POA: Diagnosis not present

## 2023-03-01 DIAGNOSIS — R42 Dizziness and giddiness: Secondary | ICD-10-CM | POA: Insufficient documentation

## 2023-03-01 DIAGNOSIS — R519 Headache, unspecified: Secondary | ICD-10-CM | POA: Insufficient documentation

## 2023-03-01 DIAGNOSIS — R55 Syncope and collapse: Secondary | ICD-10-CM | POA: Diagnosis not present

## 2023-03-01 DIAGNOSIS — R002 Palpitations: Secondary | ICD-10-CM | POA: Diagnosis not present

## 2023-03-01 LAB — BASIC METABOLIC PANEL
Anion gap: 7 (ref 5–15)
BUN: 17 mg/dL (ref 6–20)
CO2: 24 mmol/L (ref 22–32)
Calcium: 8.6 mg/dL — ABNORMAL LOW (ref 8.9–10.3)
Chloride: 105 mmol/L (ref 98–111)
Creatinine, Ser: 0.95 mg/dL (ref 0.61–1.24)
GFR, Estimated: >60 mL/min (ref >60)
Glucose, Bld: 99 mg/dL (ref 70–99)
Potassium: 4 mmol/L (ref 3.5–5.1)
Sodium: 136 mmol/L (ref 135–145)

## 2023-03-01 LAB — CBC
HCT: 46.5 % (ref 39.0–52.0)
Hemoglobin: 16 g/dL (ref 13.0–17.0)
MCH: 28.2 pg (ref 26.0–34.0)
MCHC: 34.4 g/dL (ref 30.0–36.0)
MCV: 81.9 fL (ref 80.0–100.0)
Platelets: 214 10*3/uL (ref 150–400)
RBC: 5.68 MIL/uL (ref 4.22–5.81)
RDW: 12.6 % (ref 11.5–15.5)
WBC: 7.7 10*3/uL (ref 4.0–10.5)
nRBC: 0 % (ref 0.0–0.2)

## 2023-03-01 LAB — HEPATIC FUNCTION PANEL
ALT: 23 U/L (ref 0–44)
AST: 32 U/L (ref 15–41)
Albumin: 4.4 g/dL (ref 3.5–5.0)
Alkaline Phosphatase: 70 U/L (ref 38–126)
Bilirubin, Direct: 0.2 mg/dL (ref 0.0–0.2)
Indirect Bilirubin: 1.2 mg/dL — ABNORMAL HIGH (ref 0.3–0.9)
Total Bilirubin: 1.4 mg/dL — ABNORMAL HIGH (ref 0.3–1.2)
Total Protein: 7.6 g/dL (ref 6.5–8.1)

## 2023-03-01 LAB — CBG MONITORING, ED: Glucose-Capillary: 104 mg/dL — ABNORMAL HIGH (ref 70–99)

## 2023-03-01 LAB — TROPONIN I (HIGH SENSITIVITY): Troponin I (High Sensitivity): 2 ng/L (ref <18)

## 2023-03-01 MED ORDER — MECLIZINE HCL 25 MG PO TABS
25.0000 mg | ORAL_TABLET | Freq: Once | ORAL | Status: AC
  Administered 2023-03-01: 25 mg via ORAL
  Filled 2023-03-01: qty 1

## 2023-03-01 MED ORDER — MECLIZINE HCL 25 MG PO TABS: 25.0000 mg | ORAL_TABLET | Freq: Three times a day (TID) | ORAL | 0 refills | Status: AC | PRN

## 2023-03-01 MED ORDER — ONDANSETRON HCL 4 MG/2ML IJ SOLN
4.0000 mg | Freq: Once | INTRAMUSCULAR | Status: AC
  Administered 2023-03-01: 4 mg via INTRAVENOUS
  Filled 2023-03-01: qty 2

## 2023-03-01 NOTE — ED Provider Notes (Addendum)
Paul Stein   CSN: [id number] Arrival date & time: 03/01/23  1220     History  Chief Complaint  Patient presents with   Dizziness    Paul Stein is a 32 y.o. male.  32 y.o male with a PMH of hypercholesteremia sent to the ED via [place] with a chief complaint of dizziness.  Patient reports last night, he had a heavy meal, began to feel dizzy when he laid down.  Reports the dizziness is exacerbated when he is lying flat and staring up at the sky.  He had similar episodes like these in the past, he has been evaluated by his primary care due to this, he was told that it was likely due to his high cholesterol.  He reports yesterday having somewhat of a headache, stabbing sensation to the posterior aspect of his head, reports sometimes in the morning he wakes up and gets dizzy when he tries to get out of bed.  He continues to endorse some nausea today, he is unsure whether was due to the heavy meal he had last night.  He did go to urgent care today who sent him to the emergency department after obtaining an EKG.  He reports feeling somewhat better now as far as the dizziness.  He is not having any chest pain, no shortness of breath, no vision changes, no other complaints. No prior hx of CAD.   The history is provided by the patient.  Dizziness Associated symptoms: no chest pain, no nausea and no shortness of breath        Home Medications Prior to Admission medications   Medication Sig Start Date End Date Taking? Authorizing Provider  meclizine (ANTIVERT) 25 MG tablet Take 1 tablet (25 mg total) by mouth 3 (three) times daily as needed for up to 7 days for dizziness. 03/01/23 03/08/23 Yes Joniya Boberg, Beverley Fiedler, PA-C  cetirizine (ZYRTEC) 10 MG tablet Take 10 mg by mouth daily. Patient not taking: Reported on 08/11/2022    [provider]  famotidine (PEPCID) 10 MG tablet Take 1 tablet (10 mg total) by mouth 2 (two) times daily. Patient  not taking: Reported on 08/11/2022 07/22/22 08/21/22  Bonnita Hollow, MD      Allergies    Patient has no known allergies.    Review of Systems   Review of Systems  Constitutional:  Negative for chills and fever.  Respiratory:  Negative for shortness of breath.   Cardiovascular:  Negative for chest pain.  Gastrointestinal:  Negative for abdominal pain and nausea.  Genitourinary:  Negative for flank pain.  Musculoskeletal:  Negative for back pain.  Neurological:  Positive for dizziness.  All other systems reviewed and are negative.   Physical Exam Updated Vital Signs BP (!) 141/89 (BP Location: Left Arm)   Pulse 75   Temp 98.3 F (36.8 C) (Oral)   Resp 18   Ht '6\' 3"'$  (1.905 m)   Wt 108.9 kg   SpO2 98%   BMI 30.00 kg/m  Physical Exam Vitals and nursing Stein reviewed.  Constitutional:      Appearance: He is well-developed.  HENT:     Head: Normocephalic and atraumatic.  Eyes:     General: No scleral icterus.    Pupils: Pupils are equal, round, and reactive to light.  Cardiovascular:     Heart sounds: Normal heart sounds.  Pulmonary:     Effort: Pulmonary effort is normal.     Breath sounds:  Normal breath sounds. No wheezing.  Chest:     Chest wall: No tenderness.  Abdominal:     General: Bowel sounds are normal. There is no distension.     Palpations: Abdomen is soft.     Tenderness: There is no abdominal tenderness.  Musculoskeletal:        General: No tenderness or deformity.     Cervical back: Normal range of motion.  Skin:    General: Skin is warm and dry.  Neurological:     Mental Status: He is alert and oriented to person, place, and time.     Comments: Alert, oriented, thought content appropriate. Speech fluent without evidence of aphasia. Able to follow 2 step commands without difficulty.  Cranial Nerves:  II:  Peripheral visual fields grossly normal, pupils, round, reactive to light III,IV, VI: ptosis not present, extra-ocular motions intact  bilaterally  V,VII: smile symmetric, facial light touch sensation equal VIII: hearing grossly normal bilaterally  IX,X: midline uvula rise  XI: bilateral shoulder shrug equal and strong XII: midline tongue extension  Motor:  5/5 in upper and lower extremities bilaterally including strong and equal grip strength and dorsiflexion/plantar flexion Sensory: light touch normal in all extremities.  Cerebellar: normal finger-to-nose with bilateral upper extremities, pronator drift negative Gait: normal gait and balance       ED Results / Procedures / Treatments   Labs (all labs ordered are listed, but only abnormal results are displayed) Labs Reviewed  BASIC METABOLIC PANEL - Abnormal; Notable for the following components:      Result Value   Calcium 8.6 (*)    All other components within normal limits  HEPATIC FUNCTION PANEL - Abnormal; Notable for the following components:   Total Bilirubin 1.4 (*)    Indirect Bilirubin 1.2 (*)    All other components within normal limits  CBG MONITORING, ED - Abnormal; Notable for the following components:   Glucose-Capillary 104 (*)    All other components within normal limits  CBC  TROPONIN I (HIGH SENSITIVITY)  TROPONIN I (HIGH SENSITIVITY)    EKG None  Radiology CT HEAD WO CONTRAST (5MM)  Result Date: 03/01/2023 CLINICAL DATA:  Syncope. EXAM: CT HEAD WITHOUT CONTRAST TECHNIQUE: Contiguous axial images were obtained from the base of the skull through the vertex without intravenous contrast. RADIATION DOSE REDUCTION: This exam was performed according to the departmental dose-optimization program which includes automated exposure control, adjustment of the mA and/or kV according to patient size and/or use of iterative reconstruction technique. COMPARISON:  None Available. FINDINGS: Brain: No evidence of acute infarction, hemorrhage, hydrocephalus, extra-axial collection or mass lesion/mass effect. Vascular: No hyperdense vessel or unexpected  calcification. Skull: Normal. Negative for fracture or focal lesion. Sinuses/Orbits: No acute finding. Other: None. IMPRESSION: No acute intracranial abnormalities. Electronically Signed   By: Dorise Bullion III M.D.   On: 03/01/2023 13:28    Procedures Procedures    Medications Ordered in ED Medications  meclizine (ANTIVERT) tablet 25 mg (has no administration in time range)  ondansetron (ZOFRAN) injection 4 mg (4 mg Intravenous Given 03/01/23 1343)    ED Course/ Medical Decision Making/ A&P                             Medical Decision Making Amount and/or Complexity of Data Reviewed Labs: ordered. Radiology: ordered.  Risk Prescription drug management.    This patient presents to the ED for concern of dizziness, this involves a  number of treatment options, and is a complaint that carries with it a high risk of complications and morbidity.  The differential diagnosis includes ACS, CVA, vertigo versus BPPV.   Co morbidities: Discussed in HPI   Brief History:  Patient with no prior medical history presents to the ED with a chief complaint of dizziness that began sudden onset yesterday after his meal.  Reports exacerbated by lying flat in bed and staring into the ceiling.  Was told previously by PCP that this was likely due to his high cholesterol.  Has never had any workup for dizziness in the past.  Martin Majestic to urgent care today, had EKG done and sent to the emergency department for further evaluation.  Denying any chest pain, shortness of breath, prior history of CAD.  EMR reviewed including pt PMHx, past surgical history and past visits to ER.   See HPI for more details   Lab Tests:  I ordered and independently interpreted labs.  The pertinent results include:    I personally reviewed all laboratory work and imaging. Metabolic panel without any acute abnormality specifically kidney function within normal limits and no significant electrolyte abnormalities. CBC without  leukocytosis or significant anemia.   Imaging Studies:  NAD. I personally reviewed all imaging studies and no acute abnormality found. I agree with radiology interpretation. CT Head with no acute findings.   Cardiac Monitoring:  The patient was maintained on a cardiac monitor.  I personally viewed and interpreted the cardiac monitored which showed an underlying rhythm of: NSR 81 EKG non-ischemic  Medicines ordered:  I ordered medication including zofran  for nausea Reevaluation of the patient after these medicines showed that the patient improved I have reviewed the patients home medicines and have made adjustments as needed Given meclizine to treat dizziness from likely vertigo.   Reevaluation:  After the interventions noted above I re-evaluated patient and found that they have :resolved   Social Determinants of Health:  The patient's social determinants of health were a factor in the care of this patient   Problem List / ED Course:  Patient presents to the ED with an episode of dizziness which occurred last night after he had a heavy meal and went to lay down.  He has had previous episodes of dizziness that have happened in the past.  Patient reports he has been brought up to his primary care physician in the past they thought likely due to elevated cholesterol but not currently taking medication for this. He was concerned as the episode lasted longer than they usually does.  Today he went to urgent care where he had an EKG, and states he was sent to the emergency department.  He is here without any chest pain, no shortness of breath, no prior history of cardiac disease. Interpretation of his labs by me reveal a CBC with no leukocytosis, hemoglobin is within normal limits.  BMP with no electrolyte derangement to account for the dizziness, creatinine level is unremarkable.  CBG was 104 on arrival, patient is currently fasting.  Hepatic function level with normal LFTs.  Troponin is  normal, he is currently not having any chest pain I do not feel that this is cardiac etiology.  EKG without any ST changes. Neurological component was also addressed, good reassuring neuroexam, CT head was obtained to rule out any mass dizziness has been happening for some time, without any acute findings today.  Does not have any nystagmus on my exam.  Some suspicion for BPPV, given  meclizine here as trial.  We discussed trying this medication at home if this helps.  His symptoms usually go away on their own.  I do not suspect any central etiology at this time.  I discussed with him follow-up with primary care physician, given Zofran with improvement of his nausea.  Vitals are otherwise stable, patient is hemodynamically stable for discharge.   Dispostion:  After consideration of the diagnostic results and the patients response to treatment, I feel that the patent would benefit from PCP along with ENT for perhaps      Portions of this Stein were generated with Dragon dictation software. Dictation errors may occur despite best attempts at proofreading.   Final Clinical Impression(s) / ED Diagnoses Final diagnoses:  Dizziness    Rx / DC Orders ED Discharge Orders          Ordered    meclizine (ANTIVERT) 25 MG tablet  3 times daily PRN        03/01/23 1513              Janeece Fitting, PA-C 03/01/23 1513    Janeece Fitting, PA-C 03/01/23 1513    Pattricia Boss, MD 03/01/23 [phone]

## 2023-03-01 NOTE — Discharge Instructions (Addendum)
Your laboratory results are within normal limits today.  You are prescribed meclizine in order to help with your dizziness, please take this medication as needed for dizziness.  Follow-up with your primary care physician as needed.

## 2023-03-01 NOTE — ED Triage Notes (Signed)
Patient has been feeling dizzy from 10pm-3am. It feels like the room is spinning. Urgent care sent him here after doing an EKG. Nauseous. No chest pain or shortness of breath.

## 2023-03-25 DIAGNOSIS — D225 Melanocytic nevi of trunk: Secondary | ICD-10-CM | POA: Diagnosis not present

## 2023-03-25 DIAGNOSIS — H61002 Unspecified perichondritis of left external ear: Secondary | ICD-10-CM | POA: Diagnosis not present

## 2023-03-26 ENCOUNTER — Ambulatory Visit (INDEPENDENT_AMBULATORY_CARE_PROVIDER_SITE_OTHER): Payer: BC Managed Care – PPO | Admitting: Family Medicine

## 2023-03-26 ENCOUNTER — Encounter: Payer: Self-pay | Admitting: Family Medicine

## 2023-03-26 VITALS — BP 114/80 | HR 73 | Temp 98.3°F | Ht 75.0 in | Wt 232.2 lb

## 2023-03-26 DIAGNOSIS — Z0000 Encounter for general adult medical examination without abnormal findings: Secondary | ICD-10-CM | POA: Diagnosis not present

## 2023-03-26 DIAGNOSIS — H8309 Labyrinthitis, unspecified ear: Secondary | ICD-10-CM

## 2023-03-26 LAB — URINALYSIS, ROUTINE W REFLEX MICROSCOPIC
Bilirubin Urine: NEGATIVE
Ketones, ur: NEGATIVE
Leukocytes,Ua: NEGATIVE
Nitrite: NEGATIVE
Specific Gravity, Urine: 1.02 (ref 1.000–1.030)
Total Protein, Urine: NEGATIVE
Urine Glucose: NEGATIVE
Urobilinogen, UA: 0.2 (ref 0.0–1.0)
pH: 6 (ref 5.0–8.0)

## 2023-03-26 LAB — COMPREHENSIVE METABOLIC PANEL
ALT: 17 U/L (ref 0–53)
AST: 22 U/L (ref 0–37)
Albumin: 4.7 g/dL (ref 3.5–5.2)
Alkaline Phosphatase: 81 U/L (ref 39–117)
BUN: 11 mg/dL (ref 6–23)
CO2: 30 mEq/L (ref 19–32)
Calcium: 9.3 mg/dL (ref 8.4–10.5)
Chloride: 101 mEq/L (ref 96–112)
Creatinine, Ser: 1 mg/dL (ref 0.40–1.50)
GFR: 98.94 mL/min (ref >60.00)
Glucose, Bld: 94 mg/dL (ref 70–99)
Potassium: 4.3 mEq/L (ref 3.5–5.1)
Sodium: 138 mEq/L (ref 135–145)
Total Bilirubin: 1.3 mg/dL — ABNORMAL HIGH (ref 0.2–1.2)
Total Protein: 7.3 g/dL (ref 6.0–8.3)

## 2023-03-26 LAB — LIPID PANEL
Cholesterol: 168 mg/dL (ref 0–200)
HDL: 34.1 mg/dL — ABNORMAL LOW (ref >39.00)
LDL Cholesterol: 106 mg/dL — ABNORMAL HIGH (ref 0–99)
NonHDL: 134.21
Total CHOL/HDL Ratio: 5
Triglycerides: 142 mg/dL (ref 0.0–149.0)
VLDL: 28.4 mg/dL (ref 0.0–40.0)

## 2023-03-26 LAB — CBC
HCT: 47.7 % (ref 39.0–52.0)
Hemoglobin: 16.1 g/dL (ref 13.0–17.0)
MCHC: 33.8 g/dL (ref 30.0–36.0)
MCV: 83.4 fl (ref 78.0–100.0)
Platelets: 207 10*3/uL (ref 150.0–400.0)
RBC: 5.72 Mil/uL (ref 4.22–5.81)
RDW: 13.4 % (ref 11.5–15.5)
WBC: 6.1 10*3/uL (ref 4.0–10.5)

## 2023-03-26 NOTE — Progress Notes (Signed)
Established Patient Office Visit   Subjective:  Patient ID: Paul Stein, male    DOB: 09-27-1991  Age: 32 y.o. MRN: [id number]  Chief Complaint  Patient presents with   Annual Exam    Pt here for Annual Exam and is currently fasting     HPI Encounter Diagnoses  Name Primary?   Health care maintenance Yes   Labyrinthitis, unspecified laterality    For follow-up of above.  Mostly doing well.  Was in the emergency room a few weeks ago for acute vertigo.  It is since resolved.  He has noted similar symptoms of spinning when ear congestion scratchy throat postnasal drip and sneezing.  He is currently fasting for Ramadan.   Review of Systems  Constitutional: Negative.   HENT: Negative.    Eyes:  Negative for blurred vision, discharge and redness.  Respiratory: Negative.    Cardiovascular: Negative.   Gastrointestinal:  Negative for abdominal pain.  Genitourinary: Negative.   Musculoskeletal: Negative.  Negative for myalgias.  Skin:  Negative for rash.  Neurological:  Negative for tingling, loss of consciousness and weakness.  Endo/Heme/Allergies:  Negative for polydipsia.     Current Outpatient Medications:    cetirizine (ZYRTEC) 10 MG tablet, Take 10 mg by mouth daily. (Patient not taking: Reported on 08/11/2022), Disp: , Rfl:    famotidine (PEPCID) 10 MG tablet, Take 1 tablet (10 mg total) by mouth 2 (two) times daily., Disp: 60 tablet, Rfl: 0   Objective:     BP 114/80   Pulse 73   Temp 98.3 F (36.8 C)   Ht 6\' 3"  (1.905 m)   Wt 232 lb 3.2 oz (105.3 kg)   SpO2 95%   BMI 29.02 kg/m    Physical Exam Constitutional:      General: He is not in acute distress.    Appearance: Normal appearance. He is not ill-appearing, toxic-appearing or diaphoretic.  HENT:     Head: Normocephalic and atraumatic.     Right Ear: Tympanic membrane, ear canal and external ear normal.     Left Ear: Tympanic membrane, ear canal and external ear normal.     Mouth/Throat:     Mouth:  Mucous membranes are moist.     Pharynx: Oropharynx is clear. No oropharyngeal exudate or posterior oropharyngeal erythema.  Eyes:     General: No scleral icterus.       Right eye: No discharge.        Left eye: No discharge.     Extraocular Movements: Extraocular movements intact.     Conjunctiva/sclera: Conjunctivae normal.     Pupils: Pupils are equal, round, and reactive to light.  Cardiovascular:     Rate and Rhythm: Normal rate and regular rhythm.  Pulmonary:     Effort: Pulmonary effort is normal. No respiratory distress.     Breath sounds: Normal breath sounds.  Abdominal:     General: Bowel sounds are normal.     Tenderness: There is no abdominal tenderness. There is no guarding or rebound.  Musculoskeletal:     Cervical back: No rigidity or tenderness.  Skin:    General: Skin is warm and dry.  Neurological:     Mental Status: He is alert and oriented to person, place, and time.  Psychiatric:        Mood and Affect: Mood normal.        Behavior: Behavior normal.      No results found for any visits on 03/26/23.  The ASCVD Risk score (Arnett DK, et al., 2019) failed to calculate for the following reasons:   The 2019 ASCVD risk score is only valid for ages 58 to 26    Assessment & Plan:   Health care maintenance -     CBC -     Comprehensive metabolic panel -     Lipid panel -     Urinalysis, Routine w reflex microscopic  Labyrinthitis, unspecified laterality    Return in about 1 year (around 03/25/2024), or if symptoms worsen or fail to improve.  Information was given on health maintenance and disease prevention.  Encouraged regular exercise for 30 minutes 5 days weekly.  Information was given on labyrinthitis.  We discussed this entity.  Explained that it is not related to his cholesterol levels.  Libby Maw, MD

## 2023-05-22 ENCOUNTER — Encounter: Payer: BC Managed Care – PPO | Admitting: Family Medicine

## 2023-06-13 IMAGING — CR DG FINGER RING 2+V*R*
3 series · 3 of 3 positions shown · non-contrast
Comparison: None.

CLINICAL DATA: Pain right ring finger crush injury.

EXAM:
RIGHT RING FINGER 2+V

[finger ap]
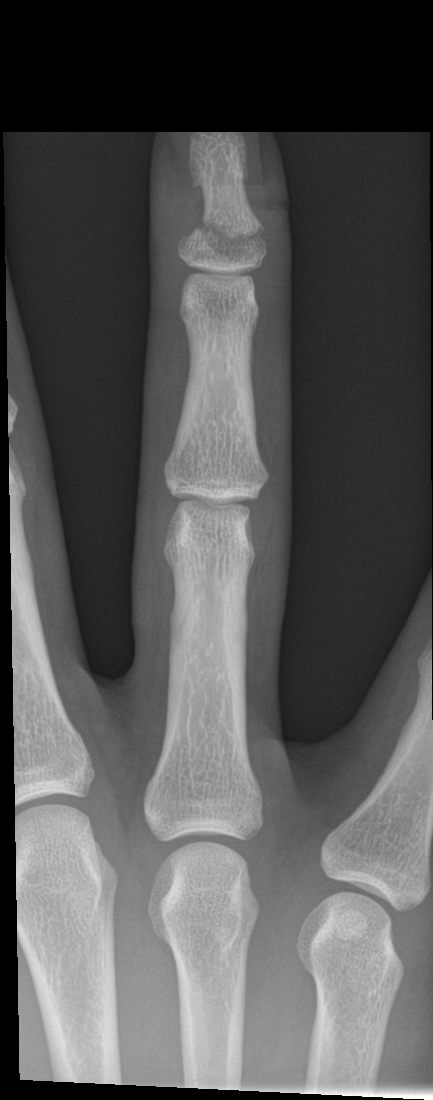

[finger obl]
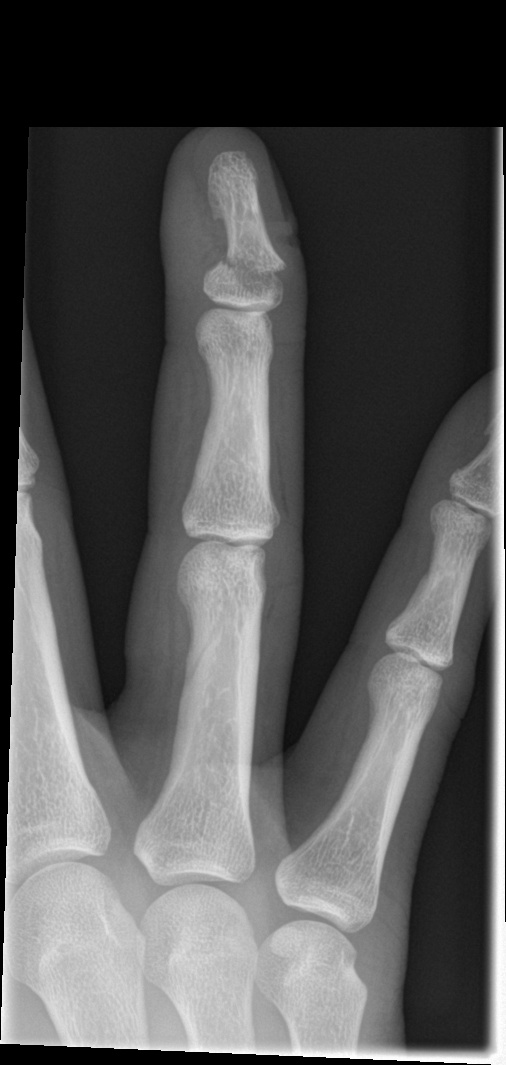

[finger lat]
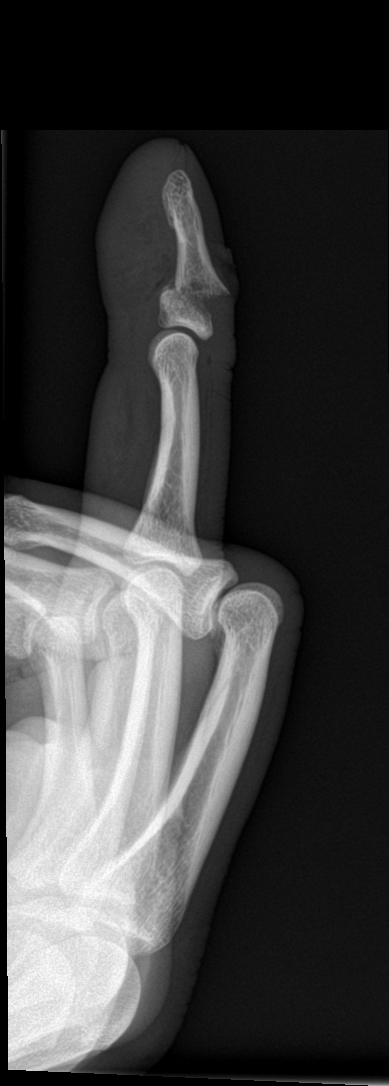

[3 of 3 positions shown; findings below may reference images not displayed]

FINDINGS: There is an acute fracture involving the base of the fourth distal
phalanx. There is mild volar angulation and mild posterior
displacement of the distal fracture fragment. Overlying soft tissue
gas and irregularity compatible with laceration.
IMPRESSION: Acute fracture involves the base of the fourth distal phalanx. Mild
volar angulation and posterior displacement of the distal fracture
fragment.

## 2024-01-08 ENCOUNTER — Telehealth: Payer: Self-pay | Admitting: Family Medicine

## 2024-01-08 NOTE — Telephone Encounter (Signed)
 error

## 2024-02-21 DIAGNOSIS — R070 Pain in throat: Secondary | ICD-10-CM | POA: Diagnosis not present

## 2024-02-21 DIAGNOSIS — J020 Streptococcal pharyngitis: Secondary | ICD-10-CM | POA: Diagnosis not present

## 2024-03-30 ENCOUNTER — Encounter: Payer: BC Managed Care – PPO | Admitting: Family Medicine

## 2024-03-31 ENCOUNTER — Encounter: Payer: BC Managed Care – PPO | Admitting: Family Medicine

## 2024-04-05 ENCOUNTER — Encounter: Payer: BC Managed Care – PPO | Admitting: Family Medicine

## 2024-04-26 ENCOUNTER — Encounter: Admitting: Family Medicine

## 2024-10-11 ENCOUNTER — Other Ambulatory Visit: Payer: Self-pay

## 2024-10-11 ENCOUNTER — Ambulatory Visit
Admission: RE | Admit: 2024-10-11 | Discharge: 2024-10-11 | Disposition: A | Discharge status: Home / Self Care | Attending: Family Medicine | Admitting: Family Medicine

## 2024-10-11 VITALS — BP 118/78 | HR 74 | Temp 98.8°F | Resp 17 | Ht 75.0 in | Wt 235.0 lb

## 2024-10-11 DIAGNOSIS — R1901 Right upper quadrant abdominal swelling, mass and lump: Secondary | ICD-10-CM

## 2024-10-11 NOTE — ED Provider Notes (Signed)
 [facility]    CSN: [id number] Arrival date & time: 10/11/24  1127      History   Chief Complaint Chief Complaint  Patient presents with   Medical Problem     HPI Paul Stein is a 33 y.o. male.  has no past medical history on file.   HPI  Discussed the use of AI scribe software for clinical note transcription with the patient, who gave verbal consent to proceed. The patient presents with a right-sided abdominal bump that enlarges after meals.  The patient has noticed a right-sided abdominal bump for the past few weeks, which becomes more prominent after consuming large meals or drinks, particularly soda, which he has since stopped drinking. The bump is not painful but is bothersome, especially when lying on that side.  He recalls a similar issue over a year ago when he experienced a bump on the right side along pectoral muscles after returning to the gym and losing weight. At that time, the bump was hard and persistent for about ten days, causing discomfort and limiting arm movement. It resolved after medication was prescribed by his PCP  He has a history of weightlifting, which he suspects might have contributed to the current issue.   No associated symptoms such as nausea, vomiting, diarrhea, constipation, fever, chills, chest tightness, or shortness of breath. There is no bruising or redness around the area.   History reviewed. No pertinent past medical history.  Patient Active Problem List   Diagnosis Date Noted   Pectoralis muscle strain 08/11/2022   Gastroesophageal reflux disease 07/22/2022   Odynophagia 07/22/2022   Health care maintenance 07/20/2018    History reviewed. No pertinent surgical history.     Home Medications    Prior to Admission medications   Not on File    Family History History reviewed. No pertinent family history.  Social History Social History   Tobacco Use   Smoking status: Never   Smokeless tobacco: Never   Vaping Use   Vaping status: Never Used  Substance Use Topics   Alcohol use: Never   Drug use: Never     Allergies   Patient has no known allergies.   Review of Systems Review of Systems  Constitutional:  Negative for chills and fever.  Respiratory:  Negative for chest tightness, shortness of breath and wheezing.   Gastrointestinal:  Negative for abdominal pain, constipation, diarrhea, nausea and vomiting.       Bump in RUQ becomes larger after a big meal      Physical Exam Triage Vital Signs ED Triage Vitals  Encounter Vitals Group     BP 10/11/24 1138 118/78     Girls Systolic BP Percentile --      Girls Diastolic BP Percentile --      Boys Systolic BP Percentile --      Boys Diastolic BP Percentile --      Pulse Rate 10/11/24 1138 74     Resp 10/11/24 1138 17     Temp 10/11/24 1138 98.8 F (37.1 C)     Temp Source 10/11/24 1138 Oral     SpO2 10/11/24 1138 96 %     Weight 10/11/24 1140 235 lb (106.6 kg)     Height 10/11/24 1140 6' 3 (1.905 m)     Head Circumference --      Peak Flow --      Pain Score 10/11/24 1140 0     Pain Loc --  Pain Education --      Exclude from Growth Chart --    No data found.  Updated Vital Signs BP 118/78 (BP Location: Right Arm)   Pulse 74   Temp 98.8 F (37.1 C) (Oral)   Resp 17   Ht 6' 3 (1.905 m)   Wt 235 lb (106.6 kg)   SpO2 96%   BMI 29.37 kg/m   Visual Acuity Right Eye Distance:   Left Eye Distance:   Bilateral Distance:    Right Eye Near:   Left Eye Near:    Bilateral Near:     Physical Exam Vitals reviewed.  Constitutional:      General: He is awake. He is not in acute distress.    Appearance: Normal appearance. He is well-developed and well-groomed. He is not ill-appearing, toxic-appearing or diaphoretic.  HENT:     Head: Normocephalic and atraumatic.  Eyes:     Extraocular Movements: Extraocular movements intact.     Conjunctiva/sclera: Conjunctivae normal.  Cardiovascular:     Rate and  Rhythm: Normal rate and regular rhythm.     Heart sounds: Normal heart sounds. No murmur heard.    No friction rub. No gallop.  Pulmonary:     Effort: Pulmonary effort is normal.     Breath sounds: Normal breath sounds. No decreased air movement. No decreased breath sounds, wheezing, rhonchi or rales.  Abdominal:     General: Abdomen is flat. Bowel sounds are normal.     Palpations: Abdomen is soft. There is mass.     Tenderness: There is no abdominal tenderness. There is no guarding. Negative signs include Murphy's sign and McBurney's sign.   Musculoskeletal:     Cervical back: Normal range of motion.  Neurological:     Mental Status: He is alert and oriented to person, place, and time.  Psychiatric:        Attention and Perception: Attention normal.        Mood and Affect: Mood normal.        Speech: Speech normal.        Behavior: Behavior normal. Behavior is cooperative.      UC Treatments / Results  Labs (all labs ordered are listed, but only abnormal results are displayed) Labs Reviewed - No data to display  EKG   Radiology No results found.  Procedures Procedures (including critical care time)  Medications Ordered in UC Medications - No data to display  Initial Impression / Assessment and Plan / UC Course  I have reviewed the triage vital signs and the nursing notes.  Pertinent labs & imaging results that were available during my care of the patient were reviewed by me and considered in my medical decision making (see chart for details).      Final Clinical Impressions(s) / UC Diagnoses   Final diagnoses:  Right upper quadrant abdominal mass   Right abdominal wall mass, possible lipoma or hernia Reports a bumpy sensation on the right side of the abdomen for a few weeks, especially after heavy meals or drinks. No associated pain, nausea, vomiting, diarrhea, constipation, fever, or chills. No bruising or redness. Differential diagnosis includes lipoma,  hernia, or lymph node. Physical examination reveals a palpable mass, possibly a lipoma or hernia. Hernia considered due to history of  weightlifting. Ultrasound recommended to differentiate between lipoma, hernia, or lymph node.  - Referred to primary care doctor for ultrasound of the right abdominal wall to evaluate the mass.    Discharge Instructions  VISIT SUMMARY:  You came in today because of a bump on the right side of your abdomen that gets bigger after meals. You mentioned that it has been there for a few weeks and is more noticeable after eating or drinking large amounts, especially soda, which you have stopped drinking. The bump is not painful but is bothersome, especially when lying on that side. You also recalled a similar issue over a year ago that resolved with medication. You have a history of weightlifting which may be contributory   YOUR PLAN:  -RIGHT ABDOMINAL WALL MASS, POSSIBLE LIPOMA OR HERNIA: A right abdominal wall mass could be a lipoma (a benign fatty lump) or a hernia (when an organ pushes through an opening in the muscle or tissue that holds it in place). Given your history of weightlifting, a hernia is a possibility. We recommend getting an ultrasound to determine the exact nature of the mass. Please schedule this ultrasound with your primary care doctor.  INSTRUCTIONS:  Please schedule an ultrasound of your right abdominal wall with your primary care doctor to evaluate the mass.     ED Prescriptions   None    PDMP not reviewed this encounter.   Kazim Corrales, Rocky BRAVO, PA-C 10/11/24 1231

## 2024-10-11 NOTE — Discharge Instructions (Signed)
 VISIT SUMMARY:  You came in today because of a bump on the right side of your abdomen that gets bigger after meals. You mentioned that it has been there for a few weeks and is more noticeable after eating or drinking large amounts, especially soda, which you have stopped drinking. The bump is not painful but is bothersome, especially when lying on that side. You also recalled a similar issue over a year ago that resolved with medication. You have a history of weightlifting which may be contributory   YOUR PLAN:  -RIGHT ABDOMINAL WALL MASS, POSSIBLE LIPOMA OR HERNIA: A right abdominal wall mass could be a lipoma (a benign fatty lump) or a hernia (when an organ pushes through an opening in the muscle or tissue that holds it in place). Given your history of weightlifting, a hernia is a possibility. We recommend getting an ultrasound to determine the exact nature of the mass. Please schedule this ultrasound with your primary care doctor.  INSTRUCTIONS:  Please schedule an ultrasound of your right abdominal wall with your primary care doctor to evaluate the mass.

## 2024-10-11 NOTE — ED Triage Notes (Signed)
 Pt presents with a chief complaint of bump on upper right side of abdomen x 4-6 weeks. Becomes more noticeable after eatings meals. No pain. Denies n/v/d. No medications taken at home for symptoms reported.

## 2024-10-17 ENCOUNTER — Ambulatory Visit: Admitting: Family Medicine

## 2024-10-17 ENCOUNTER — Encounter: Payer: Self-pay | Admitting: Family Medicine

## 2024-10-17 VITALS — BP 122/88 | HR 63 | Temp 95.0°F | Ht 75.0 in | Wt 250.8 lb

## 2024-10-17 DIAGNOSIS — Z23 Encounter for immunization: Secondary | ICD-10-CM

## 2024-10-17 DIAGNOSIS — R1900 Intra-abdominal and pelvic swelling, mass and lump, unspecified site: Secondary | ICD-10-CM

## 2024-10-17 DIAGNOSIS — N4889 Other specified disorders of penis: Secondary | ICD-10-CM | POA: Diagnosis not present

## 2024-10-17 NOTE — Progress Notes (Signed)
 Okay I will talk to you about that.  Established Patient Office Visit   Subjective:  Patient ID: Paul Stein, male    DOB: 01-05-1991  Age: 33 y.o. MRN: [id number]  Chief Complaint  Patient presents with   Abdominal Pain    Pt presents for a UC follow up, abdominal bump on his top right abdomin and wants to make sure its not a hernia, & wants to discuss genital warts, states he has small bumps     Abdominal Pain Pertinent negatives include no myalgias.   Encounter Diagnoses  Name Primary?   Defined upper border of mass of abdomen Yes   Penile papules    Immunization due    Here to address a couple concerns.  Concerned about possible mass in his right upper abdominal wall.  Also concerned about a penile lesion.  Married for 7 years.  Stable relationship.  Unaware of wife's Pap smear status.   Review of Systems  Constitutional: Negative.   HENT: Negative.    Eyes:  Negative for blurred vision, discharge and redness.  Respiratory: Negative.    Cardiovascular: Negative.   Gastrointestinal:  Positive for abdominal pain.  Genitourinary: Negative.   Musculoskeletal: Negative.  Negative for myalgias.  Skin:  Negative for rash.  Neurological:  Negative for tingling, loss of consciousness and weakness.  Endo/Heme/Allergies:  Negative for polydipsia.    No current outpatient medications on file.   Objective:     BP 122/88   Pulse 63   Temp (!) 95 F (35 C)   Ht 6' 3 (1.905 m)   Wt 250 lb 12.8 oz (113.8 kg)   SpO2 97%   BMI 31.35 kg/m    Physical Exam Constitutional:      General: He is not in acute distress.    Appearance: Normal appearance. He is not ill-appearing, toxic-appearing or diaphoretic.  HENT:     Head: Normocephalic and atraumatic.     Right Ear: External ear normal.     Left Ear: External ear normal.  Eyes:     General: No scleral icterus.       Right eye: No discharge.        Left eye: No discharge.     Extraocular Movements: Extraocular  movements intact.     Conjunctiva/sclera: Conjunctivae normal.  Cardiovascular:     Rate and Rhythm: Normal rate and regular rhythm.  Pulmonary:     Effort: Pulmonary effort is normal. No respiratory distress.     Breath sounds: No wheezing, rhonchi or rales.  Abdominal:     General: Abdomen is flat.     Palpations: Abdomen is soft.     Tenderness: There is no abdominal tenderness.     Hernia: No hernia is present. There is no hernia in the left inguinal area or right inguinal area.     Comments: Unable to find mass in abdominal wall.  Genitourinary:    Penis: Circumcised. No hypospadias, erythema, tenderness, discharge, swelling or lesions.      Testes:        Right: Mass, tenderness or swelling not present. Right testis is descended.        Left: Mass, tenderness or swelling not present. Left testis is descended.     Epididymis:     Right: Not inflamed or enlarged.     Left: Not inflamed or enlarged.    Lymphadenopathy:     Lower Body: No right inguinal adenopathy. No left inguinal adenopathy.  Skin:  General: Skin is warm and dry.  Neurological:     Mental Status: He is alert and oriented to person, place, and time.  Psychiatric:        Mood and Affect: Mood normal.        Behavior: Behavior normal.      No results found for any visits on 10/17/24.    The ASCVD Risk score (Arnett DK, et al., 2019) failed to calculate for the following reasons:   The 2019 ASCVD risk score is only valid for ages 41 to 51    Assessment & Plan:   Defined upper border of mass of abdomen -     US  Abdomen Limited; Future  Penile papules -     Ambulatory referral to Dermatology  Immunization due    Return in about 8 weeks (around 12/12/2024) for annual physical.  Patient refused recommended Tdap and HPV vaccines information was given on the Tdap and HPV vaccines.   Elsie Sim Lent, MD

## 2024-10-18 ENCOUNTER — Ambulatory Visit
Admission: RE | Admit: 2024-10-18 | Discharge: 2024-10-18 | Disposition: A | Discharge status: Home / Self Care | Source: Ambulatory Visit | Attending: Family Medicine | Admitting: Family Medicine

## 2024-10-18 DIAGNOSIS — R1901 Right upper quadrant abdominal swelling, mass and lump: Secondary | ICD-10-CM | POA: Diagnosis not present

## 2024-10-18 DIAGNOSIS — R1900 Intra-abdominal and pelvic swelling, mass and lump, unspecified site: Secondary | ICD-10-CM

## 2024-10-20 ENCOUNTER — Ambulatory Visit: Payer: Self-pay | Admitting: Family Medicine

## 2024-12-16 ENCOUNTER — Encounter: Admitting: Family Medicine

## 2025-01-20 ENCOUNTER — Encounter: Payer: Self-pay | Admitting: Family Medicine

## 2025-01-20 ENCOUNTER — Ambulatory Visit (INDEPENDENT_AMBULATORY_CARE_PROVIDER_SITE_OTHER): Admitting: Family Medicine

## 2025-01-20 VITALS — BP 116/80 | HR 69 | Temp 98.1°F | Ht 75.0 in | Wt 245.4 lb

## 2025-01-20 DIAGNOSIS — Z0000 Encounter for general adult medical examination without abnormal findings: Secondary | ICD-10-CM | POA: Diagnosis not present

## 2025-01-20 DIAGNOSIS — Z131 Encounter for screening for diabetes mellitus: Secondary | ICD-10-CM

## 2025-01-20 DIAGNOSIS — Z13220 Encounter for screening for lipoid disorders: Secondary | ICD-10-CM

## 2025-01-20 LAB — LIPID PANEL
Cholesterol: 151 mg/dL (ref 28–200)
HDL: 29.8 mg/dL — ABNORMAL LOW
LDL Cholesterol: 83 mg/dL (ref 10–99)
NonHDL: 121.2
Total CHOL/HDL Ratio: 5
Triglycerides: 190 mg/dL — ABNORMAL HIGH (ref 10.0–149.0)
VLDL: 38 mg/dL (ref 0.0–40.0)

## 2025-01-20 LAB — COMPREHENSIVE METABOLIC PANEL WITH GFR
ALT: 18 U/L (ref 3–53)
AST: 25 U/L (ref 5–37)
Albumin: 4.5 g/dL (ref 3.5–5.2)
Alkaline Phosphatase: 71 U/L (ref 39–117)
BUN: 13 mg/dL (ref 6–23)
CO2: 26 meq/L (ref 19–32)
Calcium: 8.9 mg/dL (ref 8.4–10.5)
Chloride: 105 meq/L (ref 96–112)
Creatinine, Ser: 0.86 mg/dL (ref 0.40–1.50)
GFR: 112.38 mL/min
Glucose, Bld: 91 mg/dL (ref 70–99)
Potassium: 4.3 meq/L (ref 3.5–5.1)
Sodium: 140 meq/L (ref 135–145)
Total Bilirubin: 1.1 mg/dL (ref 0.2–1.2)
Total Protein: 7.2 g/dL (ref 6.0–8.3)

## 2025-01-20 LAB — CBC WITH DIFFERENTIAL/PLATELET
Basophils Absolute: 0 K/uL (ref 0.0–0.1)
Basophils Relative: 0.5 % (ref 0.0–3.0)
Eosinophils Absolute: 0 K/uL (ref 0.0–0.7)
Eosinophils Relative: 0.5 % (ref 0.0–5.0)
HCT: 46.1 % (ref 39.0–52.0)
Hemoglobin: 15.6 g/dL (ref 13.0–17.0)
Lymphocytes Relative: 34.9 % (ref 12.0–46.0)
Lymphs Abs: 2.1 K/uL (ref 0.7–4.0)
MCHC: 33.8 g/dL (ref 30.0–36.0)
MCV: 83.2 fl (ref 78.0–100.0)
Monocytes Absolute: 0.4 K/uL (ref 0.1–1.0)
Monocytes Relative: 6.4 % (ref 3.0–12.0)
Neutro Abs: 3.5 K/uL (ref 1.4–7.7)
Neutrophils Relative %: 57.7 % (ref 43.0–77.0)
Platelets: 202 K/uL (ref 150.0–400.0)
RBC: 5.54 Mil/uL (ref 4.22–5.81)
RDW: 13.4 % (ref 11.5–15.5)
WBC: 6.1 K/uL (ref 4.0–10.5)

## 2025-01-20 LAB — HEMOGLOBIN A1C: Hgb A1c MFr Bld: 5.4 % (ref 4.6–6.5)

## 2025-01-20 NOTE — Progress Notes (Signed)
 "  Established Patient Office Visit   Subjective:  Patient ID: Paul Stein, male    DOB: 24-May-1991  Age: 34 y.o. MRN: [id number]  Chief Complaint  Patient presents with   Annual Exam    HPI Encounter Diagnoses  Name Primary?   Healthcare maintenance Yes   Screening for cholesterol level    Screening for diabetes mellitus    Here for physical today.  He is fasting.  He is doing quite well.  Remains active physically on a daily basis.  Has regular dental care.  He lives at home with his family.   Review of Systems  Constitutional: Negative.   HENT: Negative.    Eyes:  Negative for blurred vision, discharge and redness.  Respiratory: Negative.    Cardiovascular: Negative.   Gastrointestinal:  Negative for abdominal pain.  Genitourinary: Negative.   Musculoskeletal: Negative.  Negative for myalgias.  Skin:  Negative for rash.  Neurological:  Negative for tingling, loss of consciousness and weakness.  Endo/Heme/Allergies:  Negative for polydipsia.    Current Medications[1]   Objective:     BP 116/80   Pulse 69   Temp 98.1 F (36.7 C)   Ht 6' 3 (1.905 m)   Wt 245 lb 6.4 oz (111.3 kg)   SpO2 97%   BMI 30.67 kg/m    Physical Exam Constitutional:      General: He is not in acute distress.    Appearance: Normal appearance. He is not ill-appearing, toxic-appearing or diaphoretic.  HENT:     Head: Normocephalic and atraumatic.     Right Ear: Tympanic membrane, ear canal and external ear normal.     Left Ear: Tympanic membrane, ear canal and external ear normal.     Mouth/Throat:     Mouth: Mucous membranes are moist.     Pharynx: Oropharynx is clear. No oropharyngeal exudate or posterior oropharyngeal erythema.  Eyes:     General: No scleral icterus.       Right eye: No discharge.        Left eye: No discharge.     Extraocular Movements: Extraocular movements intact.     Conjunctiva/sclera: Conjunctivae normal.     Pupils: Pupils are equal, round, and  reactive to light.  Cardiovascular:     Rate and Rhythm: Normal rate and regular rhythm.  Pulmonary:     Effort: Pulmonary effort is normal. No respiratory distress.     Breath sounds: Normal breath sounds. No wheezing or rales.  Abdominal:     General: Bowel sounds are normal.     Tenderness: There is no abdominal tenderness. There is no guarding or rebound.     Hernia: No hernia is present. There is no hernia in the left inguinal area or right inguinal area.  Genitourinary:    Penis: Circumcised. No hypospadias, erythema, tenderness, discharge, swelling or lesions.      Testes:        Right: Mass, tenderness or swelling not present. Right testis is descended.        Left: Mass, tenderness or swelling not present. Left testis is descended.     Epididymis:     Right: Not inflamed or enlarged.     Left: Not inflamed or enlarged.  Musculoskeletal:     Cervical back: No rigidity or tenderness.  Lymphadenopathy:     Cervical: No cervical adenopathy.     Lower Body: No right inguinal adenopathy. No left inguinal adenopathy.  Skin:    General: Skin is warm  and dry.  Neurological:     Mental Status: He is alert and oriented to person, place, and time.  Psychiatric:        Mood and Affect: Mood normal.        Behavior: Behavior normal.      No results found for any visits on 01/20/25.    The ASCVD Risk score (Arnett DK, et al., 2019) failed to calculate for the following reasons:   The 2019 ASCVD risk score is only valid for ages 51 to 44    Assessment & Plan:   Healthcare maintenance -     CBC with Differential/Platelet -     Urinalysis, Routine w reflex microscopic  Screening for cholesterol level -     Comprehensive metabolic panel with GFR -     Lipid panel  Screening for diabetes mellitus -     Comprehensive metabolic panel with GFR -     Hemoglobin A1c    Return in about 1 year (around 01/20/2026), or Follow-up with Dr. Thedora in 1 year.  Continue healthy active  lifestyle.  Information given on health maintenance and disease prevention.  Elsie Sim Lent, MD    [1] No current outpatient medications on file.  "

## 2025-01-23 ENCOUNTER — Ambulatory Visit: Payer: Self-pay | Admitting: Family Medicine
